# Patient Record
Sex: Male | Born: 1960 | Hispanic: Yes | Marital: Married | Smoking: Never smoker
Health system: Southern US, Community
[De-identification: ages and names within clinical notes are randomized; demographics above are authoritative.]

## PROBLEM LIST (undated history)

## (undated) DIAGNOSIS — R0602 Shortness of breath: Secondary | ICD-10-CM

## (undated) DIAGNOSIS — M7989 Other specified soft tissue disorders: Secondary | ICD-10-CM

## (undated) DIAGNOSIS — M1990 Unspecified osteoarthritis, unspecified site: Secondary | ICD-10-CM

## (undated) DIAGNOSIS — L97909 Non-pressure chronic ulcer of unspecified part of unspecified lower leg with unspecified severity: Secondary | ICD-10-CM

## (undated) DIAGNOSIS — E119 Type 2 diabetes mellitus without complications: Secondary | ICD-10-CM

## (undated) DIAGNOSIS — M549 Dorsalgia, unspecified: Secondary | ICD-10-CM

## (undated) DIAGNOSIS — I872 Venous insufficiency (chronic) (peripheral): Secondary | ICD-10-CM

## (undated) DIAGNOSIS — R5383 Other fatigue: Secondary | ICD-10-CM

## (undated) DIAGNOSIS — M2550 Pain in unspecified joint: Secondary | ICD-10-CM

## (undated) DIAGNOSIS — G4730 Sleep apnea, unspecified: Secondary | ICD-10-CM

## (undated) HISTORY — DX: Sleep apnea, unspecified: G47.30

## (undated) HISTORY — DX: Venous insufficiency (chronic) (peripheral): I87.2

## (undated) HISTORY — DX: Non-pressure chronic ulcer of unspecified part of unspecified lower leg with unspecified severity: L97.909

## (undated) HISTORY — DX: Pain in unspecified joint: M25.50

## (undated) HISTORY — DX: Unspecified osteoarthritis, unspecified site: M19.90

## (undated) HISTORY — DX: Shortness of breath: R06.02

## (undated) HISTORY — PX: HERNIA REPAIR: SHX51

## (undated) HISTORY — DX: Type 2 diabetes mellitus without complications: E11.9

## (undated) HISTORY — DX: Other specified soft tissue disorders: M79.89

## (undated) HISTORY — DX: Dorsalgia, unspecified: M54.9

## (undated) HISTORY — DX: Other fatigue: R53.83

---

## 2006-05-18 ENCOUNTER — Ambulatory Visit (HOSPITAL_COMMUNITY): Admission: RE | Admit: 2006-05-18 | Discharge: 2006-05-19 | Payer: Self-pay | Admitting: General Surgery

## 2013-02-06 ENCOUNTER — Ambulatory Visit (INDEPENDENT_AMBULATORY_CARE_PROVIDER_SITE_OTHER): Payer: 59 | Admitting: Family Medicine

## 2013-02-06 VITALS — BP 116/75 | HR 79 | Temp 98.3°F | Resp 18 | Wt 391.0 lb

## 2013-02-06 DIAGNOSIS — L98499 Non-pressure chronic ulcer of skin of other sites with unspecified severity: Secondary | ICD-10-CM

## 2013-02-06 DIAGNOSIS — I83009 Varicose veins of unspecified lower extremity with ulcer of unspecified site: Secondary | ICD-10-CM

## 2013-02-06 DIAGNOSIS — S81809A Unspecified open wound, unspecified lower leg, initial encounter: Secondary | ICD-10-CM

## 2013-02-06 DIAGNOSIS — IMO0002 Reserved for concepts with insufficient information to code with codable children: Secondary | ICD-10-CM

## 2013-02-06 DIAGNOSIS — L97929 Non-pressure chronic ulcer of unspecified part of left lower leg with unspecified severity: Secondary | ICD-10-CM

## 2013-02-06 DIAGNOSIS — I83029 Varicose veins of left lower extremity with ulcer of unspecified site: Secondary | ICD-10-CM

## 2013-02-06 DIAGNOSIS — I872 Venous insufficiency (chronic) (peripheral): Secondary | ICD-10-CM

## 2013-02-06 DIAGNOSIS — R55 Syncope and collapse: Secondary | ICD-10-CM

## 2013-02-06 DIAGNOSIS — L97909 Non-pressure chronic ulcer of unspecified part of unspecified lower leg with unspecified severity: Secondary | ICD-10-CM

## 2013-02-06 DIAGNOSIS — E669 Obesity, unspecified: Secondary | ICD-10-CM

## 2013-02-06 DIAGNOSIS — G4733 Obstructive sleep apnea (adult) (pediatric): Secondary | ICD-10-CM | POA: Insufficient documentation

## 2013-02-06 LAB — POCT CBC
Granulocyte percent: 73.5 %G (ref 37–80)
HCT, POC: 44.5 % (ref 43.5–53.7)
Hemoglobin: 13.8 g/dL — AB (ref 14.1–18.1)
Lymph, poc: 1.7 (ref 0.6–3.4)
MCH, POC: 27.6 pg (ref 27–31.2)
MCHC: 31 g/dL — AB (ref 31.8–35.4)
MCV: 89 fL (ref 80–97)
MID (cbc): 0.7 (ref 0–0.9)
MPV: 10 fL (ref 0–99.8)
POC Granulocyte: 6.7 (ref 2–6.9)
POC LYMPH PERCENT: 19.1 %L (ref 10–50)
POC MID %: 7.4 %M (ref 0–12)
Platelet Count, POC: 261 10*3/uL (ref 142–424)
RBC: 5 M/uL (ref 4.69–6.13)
RDW, POC: 16.5 %
WBC: 9.1 10*3/uL (ref 4.6–10.2)

## 2013-02-06 LAB — COMPREHENSIVE METABOLIC PANEL
ALT: 13 U/L (ref 0–53)
AST: 12 U/L (ref 0–37)
Albumin: 3.8 g/dL (ref 3.5–5.2)
Alkaline Phosphatase: 86 U/L (ref 39–117)
BUN: 12 mg/dL (ref 6–23)
CO2: 28 mEq/L (ref 19–32)
Calcium: 9.2 mg/dL (ref 8.4–10.5)
Chloride: 103 mEq/L (ref 96–112)
Creat: 0.7 mg/dL (ref 0.50–1.35)
Glucose, Bld: 102 mg/dL — ABNORMAL HIGH (ref 70–99)
Potassium: 5.1 mEq/L (ref 3.5–5.3)
Sodium: 139 mEq/L (ref 135–145)
Total Bilirubin: 0.3 mg/dL (ref 0.3–1.2)
Total Protein: 7 g/dL (ref 6.0–8.3)

## 2013-02-06 LAB — POCT GLYCOSYLATED HEMOGLOBIN (HGB A1C): Hemoglobin A1C: 5.7

## 2013-02-06 LAB — GLUCOSE, POCT (MANUAL RESULT ENTRY): POC Glucose: 144 mg/dl — AB (ref 70–99)

## 2013-02-06 NOTE — Progress Notes (Signed)
Subjective:    Patient ID: Bruce Pace, male    DOB: 15-Oct-1960, 53 y.o.   MRN: [id number] Chief Complaint  Patient presents with  . Dizziness    since yesterday   . infection left leg    HPI This chart was scribed for Carley Hammed Shaw-MD by Smiley Houseman, Scribe. This patient was seen in room 12 and the patient's care was started at 1:56 PM.  HPI Comments: Bruce Pace is a 53 y.o. male with a h/o of obesity, OSA on CPAP, and chronic lower ext stasis dermatitis who presents to the [facility] complaining of an wound infection in his left leg that started about 3 months ago.  He has had problems with skin sores prior but this one just keeps getting bigger and worse so eventually decided to come in.  He has not received any prior medical care for this wound in the past 3 mos, though has required unna boots and/or antibiotics for others prior.  Pt states he has a burning sensation on his left leg occ, is present a little more at night but otherwise no pain in leg.  Pt states that he has been drinking garlic and using topical garlic poltice to help heal his wound.  Pt denies taking any oral medications or otc products/supplements to help other than occ prn tylenol. Pt denies h/o of diabetes.  Denies any loss of sensation in legs or feet.  Pt also complains of intermittent dizziness that started yesterday while he was working on a wall in his home.  He states that he had been standing for about 20 minutes when he began to feel light headed like he was going to pass out.  Did not pass out, no vertigo.  Pt states he leaned against his counter to regain his stability.  He states that the dizziness was intermittent throughout the rest of the day but not as severe.  He did note some nausea as well today, no vomiting, no other sxs at all, no other changes.  Pt denies syncope, chest pain, SOB, and the feeling of his heart racing.  He states that he has normal voids and normal bowel movements.   Pt denies any h/o of heart disease.   Pt was last seen here in 07/2010. He has not been seen by any physician or received any medical care since then. Pt has a h/o of hyperglycemia but prior hgba1cs have always been completely nml.  Paper chart reviewed - last labs in July 2012 show a HgbA1C was 5.5.  His cholesterol, CMP, Tsh, and Vitamin D were all normal. (trig mildly elev at 250s w/ low hdl but pt was NOT fasting), LDL 97 - direct LDL 117.  In July 2012, his sleep study showed very severe OSA.  He was titrated to CPAP at 15 cm of water which he has been using nightly.     Past Surgical History  Procedure Laterality Date  . Hernia repair      History reviewed. No pertinent family history.  History   Social History  . Marital Status: Married    Spouse Name: N/A    Number of Children: N/A  . Years of Education: N/A   Occupational History  . Not on file.   Social History Main Topics  . Smoking status: Never Smoker   . Smokeless tobacco: Not on file  . Alcohol Use: No  . Drug Use: No  . Sexual Activity: Not on file  Other Topics Concern  . Not on file   Social History Narrative  . No narrative on file    No Known Allergies  There are no active problems to display for this patient.  Review of Systems  Constitutional: Negative for fever and chills.  HENT: Negative for congestion and rhinorrhea.   Respiratory: Negative for cough and shortness of breath.   Cardiovascular: Positive for leg swelling. Negative for chest pain and palpitations.  Gastrointestinal: Positive for nausea. Negative for vomiting, abdominal pain and diarrhea.  Endocrine: Negative for polydipsia, polyphagia and polyuria.  Genitourinary: Negative for urgency, frequency, decreased urine volume and difficulty urinating.  Musculoskeletal: Positive for arthralgias and gait problem. Negative for back pain.  Skin: Positive for wound (left leg). Negative for color change and rash.  Neurological: Positive for  dizziness and light-headedness. Negative for syncope, weakness and numbness.  Hematological: Negative for adenopathy. Does not bruise/bleed easily.  Psychiatric/Behavioral: Negative for sleep disturbance.   Triage Vitals: BP 118/72  Pulse 82  Temp(Src) 98.3 F (36.8 C) (Oral)  Resp 18  Wt 391 lb (177.356 kg)  SpO2 93%    Objective:   Physical Exam  Nursing note and vitals reviewed. Constitutional: He is oriented to person, place, and time. He appears well-developed and well-nourished. He does not appear ill. No distress.  Morbid obesity  HENT:  Head: Normocephalic and atraumatic.  Right Ear: Tympanic membrane, external ear and ear canal normal.  Left Ear: Tympanic membrane, external ear and ear canal normal.  Nose: Nose normal. No mucosal edema or rhinorrhea.  Mouth/Throat: Uvula is midline, oropharynx is clear and moist and mucous membranes are normal. No oropharyngeal exudate, posterior oropharyngeal edema or posterior oropharyngeal erythema.  Eyes: Conjunctivae and EOM are normal. Right eye exhibits no discharge. Left eye exhibits no discharge.  Neck: Trachea normal. Neck supple. No tracheal deviation present. No thyromegaly present.  Cardiovascular: Normal rate, regular rhythm, S1 normal, S2 normal, normal heart sounds and normal pulses.   No murmur heard. Pulses:      Dorsalis pedis pulses are 2+ on the right side, and 2+ on the left side.  Pulmonary/Chest: Effort normal and breath sounds normal. No respiratory distress. He has no wheezes. He has no rhonchi. He has no rales.  Musculoskeletal: Normal range of motion.  Lymphadenopathy:       Head (right side): No submandibular, no tonsillar, no preauricular, no posterior auricular and no occipital adenopathy present.       Head (left side): No submandibular, no tonsillar, no preauricular, no posterior auricular and no occipital adenopathy present.    He has no cervical adenopathy.       Right: No supraclavicular adenopathy  present.       Left: No supraclavicular adenopathy present.  Neurological: He is alert and oriented to person, place, and time. He displays no atrophy. No sensory deficit.  Neg monofil bilateraly to feet  Skin: Skin is warm and dry. Lesion noted. No bruising, no ecchymosis, no laceration and no petechiae noted. No erythema.  4 cm diameter ulceration on left anterior medial shin with red rolled granulation tissue around borders and base with small amount of necrotic yellow exudate covering base as well.  2+ pitting edema in legs to mid calf bilaterally with thickened darkened scaling skin surrounding.  Toenails yellow, thickened, hypertrophic.   Psychiatric: He has a normal mood and affect. His behavior is normal.   Results for orders placed in visit on 02/06/13  POCT CBC  Result Value Range   WBC 9.1  4.6 - 10.2 K/uL   Lymph, poc 1.7  0.6 - 3.4   POC LYMPH PERCENT 19.1  10 - 50 %L   MID (cbc) 0.7  0 - 0.9   POC MID % 7.4  0 - 12 %M   POC Granulocyte 6.7  2 - 6.9   Granulocyte percent 73.5  37 - 80 %G   RBC 5.00  4.69 - 6.13 M/uL   Hemoglobin 13.8 (*) 14.1 - 18.1 g/dL   HCT, POC 81.1  91.4 - 53.7 %   MCV 89.0  80 - 97 fL   MCH, POC 27.6  27 - 31.2 pg   MCHC 31.0 (*) 31.8 - 35.4 g/dL   RDW, POC 78.2     Platelet Count, POC 261  142 - 424 K/uL   MPV 10.0  0 - 99.8 fL  GLUCOSE, POCT (MANUAL RESULT ENTRY)      Result Value Range   POC Glucose 144 (*) 70 - 99 mg/dl  POCT GLYCOSYLATED HEMOGLOBIN (HGB A1C)      Result Value Range   Hemoglobin A1C 5.7     EKG: NSR, no ischemic changes, unchanged from prior EKG on 07/2010 - scanned into Epic now.   negative orthostatics Assessment & Plan:  Informed pt that his sugars have increased and are prediabetic, so he needs to be on a low carb diet to lose weight.  Explained to the pt the need for an ultra sound of his heart.  Told pt that I referred him to a wound clinic since the infection is larger than his last visit.  Informed pt that he  needs to come back in a couple of days to recheck his infection and to ensure the dizziness has subsided.  Explained to pt the need to increase his water intake to decrease the swelling in this lower extremities.    Obese - Plan: EKG 12-Lead, POCT CBC, POCT glucose (manual entry), POCT glycosylated hemoglobin (Hb A1C), Comprehensive metabolic panel, TSH - has borderline pre-DM w/ hgba1c of 5.7.  Needs weighloss, low carb diet advised.  Pre-syncope - Plan: EKG 12-Lead, POCT CBC, POCT glucose (manual entry), POCT glycosylated hemoglobin (Hb A1C), Comprehensive metabolic panel, TSH, 2D Echocardiogram without contrast - unknown etiology - EKG and orthostatics unremarkable. BP low nml but unsure of baseline of pt since he has been out of medical care for a while. Check echo and advise watchful waiting. Hopefully will resolve w/ high protein diet, push fluids but if cont or worsens, RTC for further eval. Recheck 1 wk if improving.   Chronic venous stasis dermatitis of both lower extremities - needs to restart compression hose. Emphasized high protein diet, low carb, plenty of water to reduce edema and aid in wound healing. Pt's prior lipid panel 3 yrs ago was actually pretty good so no sig concern about high chol diet at this time.  Venous stasis ulcer of left lower extremity - Plan: AMB referral to wound care center - wound cleaned with soapy water after topical emla cream and small amount necrotic tissue off base debrided with gauze, wound bed of healthy granulation tissue visible. Unna boot applied to entire left lower leg by myself and ace wrap covering. Leave in place x 1 wk - then remove here and consider replacing if healing if pt does not have appt w/ wound clinic yet.  Meds ordered this encounter  Medications  . acetaminophen (TYLENOL) 500 MG tablet  Sig: Take 500 mg by mouth every 6 (six) hours as needed.    I personally performed the services described in this documentation, which was scribed  in my presence. The recorded information has been reviewed and considered, and addended by me as needed.  Norberto SorensonEva Shaw, MD MPH

## 2013-02-06 NOTE — Patient Instructions (Addendum)
Stasis Ulcer Stasis ulcers occur in the legs when the circulation is damaged. An ulcer may look like a small hole in the skin.  CAUSES Stasis ulcers occur because your veins do not work properly. Veins have valves that help the blood return to the heart. If these valves do not work right, blood flows backwards and backs up into the veins near the skin. This condition causes the veins to become larger because of increased pressure and may lead to a stasis ulcer. SYMPTOMS   Shallow (superficial) sore on the leg.  Clear drainage or weeping from the sore.  Leg pain or a feeling of heaviness. This may be worse at the end of the day.  Leg swelling.  Skin color changes. DIAGNOSIS  Your caregiver will make a diagnosis by examining your leg. Your caregiver may order tests such as an ultrasound or other studies to evaluate the blood flow of the leg. HOME CARE INSTRUCTIONS   Do not stand or sit in one position for long periods of time. Do not sit with your legs crossed. Rest with your legs raised during the day. If possible, it is best if you can elevate your legs above your heart for 30 minutes, 3 to 4 times a day.  Wear elastic stockings or support hose. Do not wear other tight encircling garments around legs, pelvis, or waist. This causes increased pressure in your veins. If your caregiver has applied compressive medicated wraps, use them as instructed.  Walk as much as possible to increase blood flow. If you are taking long rides in a car or plane, take a break to walk around every 2 hours. If not already on aspirin, take a baby aspirin before long trips unless you have medical reasons that prohibit this.  Raise the foot of your bed at night with 2-inch blocks if approved by your caregiver. This may not be desirable if you have heart failure or breathing problems.  If you get a cut in the skin over the vein and the vein bleeds, lie down with your leg raised and gently clean the area with a clean  cloth. Apply pressure on the cut until the bleeding stops. Then place a dressing on the cut. See your caregiver if it continues to bleed or needs stitches. Also, see your caregiver if you develop an infection.Signs of an infection include a fever, redness, increased pain, and drainage of pus.  If your caregiver has given you a follow-up appointment, it is very important to keep that appointment. Not keeping the appointment could result in a chronic or permanent injury, pain, and disability. If there is any problem keeping the appointment, call your caregiver for assistance. SEEK IMMEDIATE MEDICAL CARE IF:  The ulcer area starts to break down.  You have pain, redness, tenderness, pus, or hard swelling in your leg over a vein or near the ulcer.  Your leg pain is uncomfortable.  You develop an unexplained fever.  You develop chest pain or shortness of breath. Document Released: 10/04/2000 Document Revised: 04/03/2011 Document Reviewed: 05/01/2010 [facility] Patient Information 2014 [place]. Near-Syncope Near-syncope (commonly known as near fainting) is sudden weakness, dizziness, or feeling like you might pass out. During an episode of near-syncope, you may also develop pale skin, have tunnel vision, or feel sick to your stomach (nauseous). Near-syncope may occur when getting up after sitting or while standing for a long time. It is caused by a sudden decrease in blood flow to the brain. This decrease can result  from various causes or triggers, most of which are not serious. However, because near-syncope can sometimes be a sign of something serious, a medical evaluation is required. The specific cause is often not determined. HOME CARE INSTRUCTIONS  Monitor your condition for any changes. The following actions may help to alleviate any discomfort you are experiencing:  Have someone stay with you until you feel stable.  Lie down right away if you start feeling like you might faint. Breathe  deeply and steadily. Wait until all the symptoms have passed. Most of these episodes last only a few minutes. You may feel tired for several hours.   Drink enough fluids to keep your urine clear or pale yellow.   If you are taking blood pressure or heart medicine, get up slowly when seated or lying down. Take several minutes to sit and then stand. This can reduce dizziness.  Follow up with your health care provider as directed. SEEK IMMEDIATE MEDICAL CARE IF:   You have a severe headache.   You have unusual pain in the chest, abdomen, or back.   You are bleeding from the mouth or rectum, or you have black or tarry stool.   You have an irregular or very fast heartbeat.   You have repeated fainting or have seizure-like jerking during an episode.   You faint when sitting or lying down.   You have confusion.   You have difficulty walking.   You have severe weakness.   You have vision problems.  MAKE SURE YOU:   Understand these instructions.  Will watch your condition.  Will get help right away if you are not doing well or get worse. Document Released: 01/09/2005 Document Revised: 09/11/2012 Document Reviewed: 06/14/2012 [facility] Patient Information 2014 [place], [place].

## 2013-02-07 LAB — TSH: TSH: 2.266 u[IU]/mL (ref 0.350–4.500)

## 2013-02-09 ENCOUNTER — Encounter: Payer: Self-pay | Admitting: Family Medicine

## 2013-02-14 ENCOUNTER — Ambulatory Visit (INDEPENDENT_AMBULATORY_CARE_PROVIDER_SITE_OTHER): Payer: 59 | Admitting: Family Medicine

## 2013-02-14 VITALS — BP 124/84 | HR 73 | Temp 98.4°F | Resp 17 | Ht 66.5 in | Wt 372.0 lb

## 2013-02-14 DIAGNOSIS — I83009 Varicose veins of unspecified lower extremity with ulcer of unspecified site: Secondary | ICD-10-CM

## 2013-02-14 DIAGNOSIS — L97909 Non-pressure chronic ulcer of unspecified part of unspecified lower leg with unspecified severity: Secondary | ICD-10-CM

## 2013-02-14 DIAGNOSIS — I83029 Varicose veins of left lower extremity with ulcer of unspecified site: Secondary | ICD-10-CM

## 2013-02-14 DIAGNOSIS — L97929 Non-pressure chronic ulcer of unspecified part of left lower leg with unspecified severity: Secondary | ICD-10-CM

## 2013-02-14 LAB — POCT CBC
Granulocyte percent: 77.6 %G (ref 37–80)
HCT, POC: 44.9 % (ref 43.5–53.7)
Hemoglobin: 14.1 g/dL (ref 14.1–18.1)
Lymph, poc: 1.6 (ref 0.6–3.4)
MCH, POC: 27.7 pg (ref 27–31.2)
MCHC: 31.4 g/dL — AB (ref 31.8–35.4)
MCV: 88.3 fL (ref 80–97)
MID (cbc): 0.6 (ref 0–0.9)
MPV: 9.9 fL (ref 0–99.8)
POC Granulocyte: 7.8 — AB (ref 2–6.9)
POC LYMPH PERCENT: 16.2 %L (ref 10–50)
POC MID %: 6.2 %M (ref 0–12)
Platelet Count, POC: 274 10*3/uL (ref 142–424)
RBC: 5.09 M/uL (ref 4.69–6.13)
RDW, POC: 16.7 %
WBC: 10.1 10*3/uL (ref 4.6–10.2)

## 2013-02-14 MED ORDER — CEPHALEXIN 500 MG PO CAPS: 500.0000 mg | ORAL_CAPSULE | Freq: Three times a day (TID) | ORAL | Status: DC

## 2013-02-14 NOTE — Patient Instructions (Addendum)
Wound care appt: Thursday 2/5 at 9:15 am Address: [address], [place], [place] [address] [phone]   To find out more about weight loss surgery through [facility]:  [email or web address] Or call-  [phone]  Please come and see us in 4- 5 days and we will retreat your leg ulcer  Take the keflex antibiotic as directed  If you are feeling worse or are having a fever or flu- like symptoms please return right away.

## 2013-02-14 NOTE — Progress Notes (Signed)
[facility] [address] [place] [address] [phone]  Date:  02/14/2013   Name:  Bruce Pace   DOB:  [birth year]   MRN:  [id number]  PCP:  No primary provider on file.    Chief Complaint: Follow-up   History of Present Illness:  Bruce Pace is a 53 y.o. very pleasant male patient who presents with the following:  Seen here 8 days ago with exacerbation of a chronic venous stasis demattiits. He was noted to have a 4cm ulceration of the left anterior medial shin with necrotic base.  He was referred to wound care center and treated with an unnaboot. He has left the unna boot on and is here today to have this looked at.  He has not yet heard about his wound care appt.  He notes that he has felt a little dizzy on occasion but overall he does not feel bad. No fever.    He is really trying to lose wieght- he has cut out soda in exchange for water, and is avoidiing sugars. He is hopeful that this might have helped, but he would also be interested in learning more about weight loss surgery   Patient Active Problem List   Diagnosis Date Noted  . Obstructive sleep apnea 02/06/2013  . Chronic venous stasis dermatitis of both lower extremities 02/06/2013    History reviewed. No pertinent past medical history.  Past Surgical History  Procedure Laterality Date  . Hernia repair      History  Substance Use Topics  . Smoking status: Never Smoker   . Smokeless tobacco: Not on file  . Alcohol Use: No    History reviewed. No pertinent family history.  No Known Allergies  Medication list has been reviewed and updated.  Current Outpatient Prescriptions on File Prior to Visit  Medication Sig Dispense Refill  . acetaminophen (TYLENOL) 500 MG tablet Take 500 mg by mouth every 6 (six) hours as needed.       No current facility-administered medications on file prior to visit.    Review of Systems:  As per HPI- otherwise negative.   Physical  Examination: Filed Vitals:   02/14/13 1058  BP: 124/84  Pulse: 73  Temp: 98.4 F (36.9 C)  Resp: 17   Filed Vitals:   02/14/13 1058  Height: 5' 6.5" (1.689 m)  Weight: 372 lb (168.738 kg)   Body mass index is 59.15 kg/(m^2). Ideal Body Weight: Weight in (lb) to have BMI = 25: 156.9  GEN: WDWN, NAD, Non-toxic, A & O x 3, morbid obesity HEENT: Atraumatic, Normocephalic. Neck supple. No masses, No LAD. Ears and Nose: No external deformity. CV: RRR, No M/G/R. No JVD. No thrill. No extra heart sounds. PULM: CTA B, no wheezes, crackles, rhonchi. No retractions. No resp. distress. No accessory muscle use. EXTR: No c/c/e NEURO Normal gait.  PSYCH: Normally interactive. Conversant. Not depressed or anxious appearing.  Calm demeanor.  Left LE: he continues to have an ulcer on the medial left shin.  There is an odor and some discharge on his unna boot and ace wrap that suggest infection.  However he states that the area looks as good or better than it has.  His swelling is reduced.   Evidence of chronic venous stasis on both LE with hyperpigmentation and edema  Area cleaned with peroxide, placed vaseline gauze and then an unna- boot and ace wrap X2 over his ulcer.   Wt Readings from Last 3  Encounters:  02/14/13 372 lb (168.738 kg)  02/06/13 391 lb (177.356 kg)     Results for orders placed in visit on 02/14/13  POCT CBC      Result Value Range   WBC 10.1  4.6 - 10.2 K/uL   Lymph, poc 1.6  0.6 - 3.4   POC LYMPH PERCENT 16.2  10 - 50 %L   MID (cbc) 0.6  0 - 0.9   POC MID % 6.2  0 - 12 %M   POC Granulocyte 7.8 (*) 2 - 6.9   Granulocyte percent 77.6  37 - 80 %G   RBC 5.09  4.69 - 6.13 M/uL   Hemoglobin 14.1  14.1 - 18.1 g/dL   HCT, POC 40.944.9  81.143.5 - 53.7 %   MCV 88.3  80 - 97 fL   MCH, POC 27.7  27 - 31.2 pg   MCHC 31.4 (*) 31.8 - 35.4 g/dL   RDW, POC 91.416.7     Platelet Count, POC 274  142 - 424 K/uL   MPV 9.9  0 - 99.8 fL     Assessment and Plan: Venous stasis ulcer of left  lower extremity - Plan: POCT CBC, cephALEXin (KEFLEX) 500 MG capsule  Morbid obesity  Replaced unna boot and bandage as above.  Start on keflex for possible early infection He will RTC for recheck in 4 or 5 days- Sooner if worse.  If any fever or flu like sx come back right away!  Made wound care appt for him in about 2 weeks May benefit from hyperbaric therapy [facility] so he can learn more about his options encouraged him to continue his weight loss efforts- he is already making progress   Signed Abbe AmsterdamJessica Luwana Butrick, MD

## 2013-02-27 ENCOUNTER — Encounter (HOSPITAL_BASED_OUTPATIENT_CLINIC_OR_DEPARTMENT_OTHER): Payer: Managed Care, Other (non HMO) | Attending: Internal Medicine

## 2013-02-27 DIAGNOSIS — L97809 Non-pressure chronic ulcer of other part of unspecified lower leg with unspecified severity: Secondary | ICD-10-CM | POA: Insufficient documentation

## 2013-02-27 DIAGNOSIS — I872 Venous insufficiency (chronic) (peripheral): Secondary | ICD-10-CM | POA: Insufficient documentation

## 2013-02-27 DIAGNOSIS — I87309 Chronic venous hypertension (idiopathic) without complications of unspecified lower extremity: Secondary | ICD-10-CM | POA: Insufficient documentation

## 2013-02-28 NOTE — Progress Notes (Signed)
[facility]  NAME:  Bruce Pace, [name]                ACCOUNT NO.:  [id number]  MEDICAL RECORD NO.:  [id number]      DATE OF BIRTH:  [birth year]  PHYSICIAN:  Maxwell CaulMichael G. Caryl Manas, M.D.      VISIT DATE:                                  OFFICE VISIT   HISTORY:  This is a 53 year old man [facility] a wound that had been present for the last 3 months on his left leg.  The patient does not give a history of trauma. He has a history of venous stasis type wounds previously but they appeared to have healed spontaneously.  He is not a diabetic and does not have any known arterial problems.  He was seen on February 06, 2013, for this wound.  He had the wound cleaned and a small amount of necrotic tissue was debrided.  He had an Radio broadcast assistantUnna boot applied.  At that point in time, the wound measured 4 cm.  PAST MEDICAL HISTORY: 1. Chronic venous stasis of both lower extremities. 2. Previous venous stasis ulcerations. 3. Morbid obesity. 4. Hernia repair.  MEDICATION LIST:  Reviewed.  He is only on Tylenol.  He was put on an antibiotic although I do not see precisely which one this was.  PHYSICAL EXAMINATION:  VITAL SIGNS:  Temperature is 98.3, pulse 67, respirations 16, blood pressure 143/88, weight 370 pounds.  His ABI is measured in this clinic was 1.14 on the left. RESPIRATORY:  Shallow but otherwise clear air entry bilaterally. CARDIAC:  Heart sounds are normal.  There were no signs of congestive heart failure. EXTREMITIES:  Left lower extremity reveals changes of severe venous stasis left greater than the right.  His dorsalis pedis pulse was briskly palpable, and his capillary refill time was normal.  The wound is on the medial aspect of the left leg.  This has already filled in surprisingly with healthy epithelium.  There is no evidence of infection.  The overall diameter of the wound area was 4.5 x 3.6 x 0.1, however, as  mentioned the center part of this wound had already epithelialized fairly nicely.  IMPRESSIONS:  Venous hypertension with a history of venous stasis ulceration.  This had already done nicely presumably with previous Unna wraps.  We applied collagen Hydrogel and replace the unna.  He will eventually need graded pressure stockings.  The patient works as a Education administratorpainter.  He is on his feet for large parts of the work day.  We will see him again in a week's time.  I did not prescribe any additional antibiotics, none were deemed necessary.          ______________________________ Maxwell CaulMichael G. Tanzania Basham, M.D.     MGR/MEDQ  D:  02/27/2013  T:  02/28/2013  Job:  [id number]

## 2014-08-13 ENCOUNTER — Ambulatory Visit (INDEPENDENT_AMBULATORY_CARE_PROVIDER_SITE_OTHER): Payer: BLUE CROSS/BLUE SHIELD | Admitting: Family Medicine

## 2014-08-13 VITALS — BP 102/68 | HR 78 | Temp 98.3°F | Resp 18 | Ht 67.0 in | Wt 380.0 lb

## 2014-08-13 DIAGNOSIS — L03116 Cellulitis of left lower limb: Secondary | ICD-10-CM

## 2014-08-13 DIAGNOSIS — Z6841 Body Mass Index (BMI) 40.0 and over, adult: Secondary | ICD-10-CM | POA: Insufficient documentation

## 2014-08-13 MED ORDER — AMOXICILLIN-POT CLAVULANATE 875-125 MG PO TABS: 1.0000 | ORAL_TABLET | Freq: Two times a day (BID) | ORAL | Status: DC

## 2014-08-13 MED ORDER — FUROSEMIDE 40 MG PO TABS: 40.0000 mg | ORAL_TABLET | Freq: Every day | ORAL | Status: DC

## 2014-08-13 NOTE — Progress Notes (Signed)
This chart was scribed for Elvina Sidle, MD by Stann Ore, medical scribe at [facility].The patient was seen in exam room 9 and the patient's care was started at 11:10 AM.  Patient ID: Bruce Pace MRN: [id number], DOB: 26-Apr-1960, 54 y.o. Date of Encounter: 08/13/2014  Primary Physician: No PCP Per Patient  Chief Complaint:  Chief Complaint  Patient presents with  . leg wound    left side, few months  . Leg Swelling    right side, x 1 week    HPI:  Bruce Pace is a 54 y.o. male who presents to [facility] complaining of swelling in his right leg that started last week.  He also has an infected wound on his left leg for a few months. He denies any known drug allergies.   He wants to loose some weight.   He works as a Education administrator for [employer].   History reviewed. No pertinent past medical history.   Home Meds: Prior to Admission medications   Medication Sig Start Date End Date Taking? Authorizing Provider  Naproxen Sodium (ALEVE PO) Take 20 mg by mouth daily.   Yes Historical Provider, MD  acetaminophen (TYLENOL) 500 MG tablet Take 500 mg by mouth every 6 (six) hours as needed.    Historical Provider, MD  cephALEXin (KEFLEX) 500 MG capsule Take 1 capsule (500 mg total) by mouth 3 (three) times daily. Patient not taking: Reported on 08/13/2014 02/14/13   Pearline Cables, MD    Allergies: No Known Allergies  History   Social History  . Marital Status: Married    Spouse Name: N/A  . Number of Children: N/A  . Years of Education: N/A   Occupational History  . Not on file.   Social History Main Topics  . Smoking status: Never Smoker   . Smokeless tobacco: Not on file  . Alcohol Use: No  . Drug Use: No  . Sexual Activity: Not on file   Other Topics Concern  . Not on file   Social History Narrative     Review of Systems: Constitutional: negative for chills, fever, night sweats, weight changes, or fatigue    HEENT: negative for vision changes, hearing loss, congestion, rhinorrhea, ST, epistaxis, or sinus pressure Cardiovascular: negative for chest pain or palpitations Respiratory: negative for hemoptysis, wheezing, shortness of breath, or cough Abdominal: negative for abdominal pain, nausea, vomiting, diarrhea, or constipation Dermatological: negative for rash; positive for wound (left leg) Neurologic: negative for headache, dizziness, or syncope Musc: positive for swelling (right leg)  All other systems reviewed and are otherwise negative with the exception to those above and in the HPI.  Physical Exam: Blood pressure 102/68, pulse 78, temperature 98.3 F (36.8 C), temperature source Oral, resp. rate 18, height 5\' 7"  (1.702 m), weight 380 lb (172.367 kg), SpO2 97 %., Body mass index is 59.5 kg/(m^2). General: Well developed, well nourished, in no acute distress. Head: Normocephalic, atraumatic, eyes without discharge, sclera non-icteric, nares are without discharge. Bilateral auditory canals clear, TM's are without perforation, pearly grey and translucent with reflective cone of light bilaterally. Oral cavity moist, posterior pharynx without exudate, erythema, peritonsillar abscess, or post nasal drip.  Neck: Supple. No thyromegaly. Full ROM. No lymphadenopathy. Lungs: Clear bilaterally to auscultation without wheezes, rales, or rhonchi. Breathing is unlabored. Heart: RRR with S1 S2. No murmurs, rubs, or gallops appreciated. Abdomen: Soft, non-tender, non-distended with normoactive bowel sounds. No hepatomegaly. No rebound/guarding. No obvious  abdominal masses. His abdomen is protuberant with a large panniculus. Msk:  Strength and tone normal for age. Extremities/Skin:  Patient has a 1-1/2 cm ulceration on the left calf. Bilaterally there are marked postphlebitic changes. He has 2+ edema up to the knees. Neuro: Alert and oriented X 3. Moves all extremities spontaneously. Gait is normal. CNII-XII  grossly in tact. Psych:  Responds to questions appropriately with a normal affect.    I spent extra time discussing diet , trying to do patient reduce his carbohydrates and salt. He understands that he has morbid obesity and that the only solution for his chronic knee pain and leg ulcerations will be weight loss.  ASSESSMENT AND PLAN:  54 y.o. year old male with male with   This chart was scribed in my presence and reviewed by me personally.    ICD-9-CM ICD-10-CM   1. Cellulitis of left lower extremity 682.6 L03.116 amoxicillin-clavulanate (AUGMENTIN) 875-125 MG per tablet     furosemide (LASIX) 40 MG tablet  2. Morbid obesity 278.01 E66.01 Ambulatory referral to General Surgery     Signed, Elvina Sidle, MD  Signed, Elvina Sidle, MD 08/13/2014 11:10 AM

## 2014-08-13 NOTE — Patient Instructions (Signed)
1. Wash the legs twice a day with soap and water  2 the main problem is your weight. I'm referring him to a surgeon to explore bypass surgery 3 any to avoid large amounts of salt reduce the swelling in your legs.  4 please return in 1-2 weeks if the laser not improving. Otherwise I like to see you back in a month.

## 2014-09-29 ENCOUNTER — Ambulatory Visit (INDEPENDENT_AMBULATORY_CARE_PROVIDER_SITE_OTHER): Payer: BLUE CROSS/BLUE SHIELD | Admitting: Physician Assistant

## 2014-09-29 VITALS — BP 124/80 | HR 72 | Temp 97.9°F | Resp 20 | Ht 66.5 in | Wt 381.0 lb

## 2014-09-29 DIAGNOSIS — I83008 Varicose veins of unspecified lower extremity with ulcer other part of lower leg: Secondary | ICD-10-CM | POA: Diagnosis not present

## 2014-09-29 DIAGNOSIS — I872 Venous insufficiency (chronic) (peripheral): Secondary | ICD-10-CM | POA: Diagnosis not present

## 2014-09-29 DIAGNOSIS — Z131 Encounter for screening for diabetes mellitus: Secondary | ICD-10-CM

## 2014-09-29 DIAGNOSIS — I83009 Varicose veins of unspecified lower extremity with ulcer of unspecified site: Secondary | ICD-10-CM

## 2014-09-29 DIAGNOSIS — L97909 Non-pressure chronic ulcer of unspecified part of unspecified lower leg with unspecified severity: Secondary | ICD-10-CM

## 2014-09-29 DIAGNOSIS — G4733 Obstructive sleep apnea (adult) (pediatric): Secondary | ICD-10-CM | POA: Diagnosis not present

## 2014-09-29 LAB — COMPLETE METABOLIC PANEL WITH GFR
ALT: 13 U/L (ref 9–46)
AST: 12 U/L (ref 10–35)
Albumin: 3.7 g/dL (ref 3.6–5.1)
Alkaline Phosphatase: 76 U/L (ref 40–115)
BUN: 12 mg/dL (ref 7–25)
CO2: 29 mmol/L (ref 20–31)
Calcium: 9.2 mg/dL (ref 8.6–10.3)
Chloride: 102 mmol/L (ref 98–110)
Creat: 0.65 mg/dL — ABNORMAL LOW (ref 0.70–1.33)
GFR, Est African American: >89 mL/min (ref >=60)
GFR, Est Non African American: >89 mL/min (ref >=60)
Glucose, Bld: 93 mg/dL (ref 65–99)
Potassium: 4.5 mmol/L (ref 3.5–5.3)
Sodium: 139 mmol/L (ref 135–146)
Total Bilirubin: 0.3 mg/dL (ref 0.2–1.2)
Total Protein: 7 g/dL (ref 6.1–8.1)

## 2014-09-29 LAB — GLUCOSE, POCT (MANUAL RESULT ENTRY): POC Glucose: 100 mg/dl — AB (ref 70–99)

## 2014-09-29 LAB — POCT GLYCOSYLATED HEMOGLOBIN (HGB A1C): Hemoglobin A1C: 5.8

## 2014-09-29 MED ORDER — DOXYCYCLINE HYCLATE 100 MG PO CAPS: 100.0000 mg | ORAL_CAPSULE | Freq: Two times a day (BID) | ORAL | Status: AC

## 2014-09-29 NOTE — Patient Instructions (Signed)
I am referring you to wound care.  You will come back for the wound care, IF wound clinic does not contact you with an appointment that is within a week from now, please come back here in 1 week.   I am referring you to a nutritionist.  Please await the call.

## 2014-09-29 NOTE — Progress Notes (Signed)
[facility] [address] [place] [address] [phone]  Date:  09/29/2014   Name:  Bruce Pace   DOB:  05-05-1960   MRN:  [id number]  PCP:  No PCP Per Patient    History of Present Illness:  Bruce Pace is a 54 y.o. male patient who presents to [facility] for chief complaint of sores/wounds on his legs.  Patient was seen about 1 month ago and treated for cellulitis. He was given Augmentin and advised to return in one week if his wounds did not heal. He should did not return to the clinic and states that he has continued to have difficulty with the healing of these wounds. There is mild tenderness to the wound area. He denies loss of sensation to lower extremities or feet. He denies fever, nausea, shortness of breath, or dizziness. 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 He uses soap, water, and hydrogen peroxide. Not cover the wounds when he works. Patient is currently not on any type of medication. He struggles with central obesity. Dr. Milus Glazier had referred him to general surgery for possible bypass. Patient reports that he did attend the meeting, but has difficulty with dieting. He has difficulty understanding what to eat. He consumes vegetables meats, tortillas, and drinks carbonated beverages. He does not exercise due to knee pain.   Patient is struggled with weight throughout his childhood and into a total life. Difficulty staying motivated with dieting and is confused as to what to eat.  Also complains of sleep machine.  Uses cpap machine for slumber, but has had masks for years.  He is concerned that they do not fit properly any more.     Patient Active Problem List   Diagnosis Date Noted  . Morbid obesity 08/13/2014  . Obstructive sleep apnea 02/06/2013  . Chronic venous stasis dermatitis of both lower extremities 02/06/2013    History reviewed. No pertinent past medical  history.  Past Surgical History  Procedure Laterality Date  . Hernia repair      Social History  Substance Use Topics  . Smoking status: Never Smoker   . Smokeless tobacco: None  . Alcohol Use: No    History reviewed. No pertinent family history.  No Known Allergies  Medication list has been reviewed and updated.  Current Outpatient Prescriptions on File Prior to Visit  Medication Sig Dispense Refill  . acetaminophen (TYLENOL) 500 MG tablet Take 500 mg by mouth every 6 (six) hours as needed.    Marland Kitchen amoxicillin-clavulanate (AUGMENTIN) 875-125 MG per tablet Take 1 tablet by mouth 2 (two) times daily. (Patient not taking: Reported on 09/29/2014) 40 tablet 0  . furosemide (LASIX) 40 MG tablet Take 1 tablet (40 mg total) by mouth daily. (Patient not taking: Reported on 09/29/2014) 30 tablet 3   No current facility-administered medications on file prior to visit.    ROS ROS otherwise unremarkable unless listed above.  Physical Examination: BP 124/80 mmHg  Pulse 72  Temp(Src) 97.9 F (36.6 C) (Oral)  Resp 20  Ht 5' 6.5" (1.689 m)  Wt 381 lb (172.82 kg)  BMI 60.58 kg/m2  SpO2 94% Ideal Body Weight: Weight in (lb) to have BMI = 25: 156.9  Physical Exam  Constitutional: He is oriented to person, place, and time. He appears well-developed and well-nourished.  HENT:  Head: Normocephalic and atraumatic.  Eyes: EOM  are normal. Pupils are equal, round, and reactive to light.  Cardiovascular: Normal rate, regular rhythm and normal heart sounds.  Exam reveals no friction rub.   No murmur heard. Pulmonary/Chest: Effort normal and breath sounds normal. No respiratory distress. He has no wheezes.  Neurological: He is alert and oriented to person, place, and time.  Skin: Skin is warm and dry.  Bilateral hyperpigmentation that begins from the shin and extends inferiorly sparing the ankles distally. Left leg features a bout a 4 cm ulceration with necrotic borders. Mildly weeping clear  drainage. Smaller ulceration just lateral with necrotic tissue covering wound that reaches through the dermis. Right leg with 2 cm ulceration with necrotic dried tissue and debris.  Psychiatric: He has a normal mood and affect. His behavior is normal.   Results for orders placed or performed in visit on 09/29/14  POCT glucose (manual entry)  Result Value Ref Range   POC Glucose 100 (A) 70 - 99 mg/dl  POCT glycosylated hemoglobin (Hb A1C)  Result Value Ref Range   Hemoglobin A1C 5.8     Procedure: Verbal consent obtained.  Wounds cleansed with soap and water.  Generous debridement with pulling necrotic skin, and pressured washing.  Fabric and hair were removed in abundance.    Assessment and Plan: 54 year old male with chronic venous insufficiency is here today with chief complaint of wounds on the legs.   1. Stasis ulcer of lower extremity, unspecified laterality -Unna boot placed after debridement. I'm referring to him to a wound care clinic but of course advised him to return in one week if wound clinic does not have appointment available within that week where we can change this Unna boot. From observation of this wound, it appears as if this will need months of follow-up for granulated tissue to form in this area.I'm referring to him to a wound care clinic but of course advised him to return in one week if wound clinic does not have appointment available within that week where we can change this Unna boot. From observation of this wound, it appears as if this will need months of follow-up for granulated tissue to form in this area. -Will cover for mrsa at this time. - COMPLETE METABOLIC PANEL WITH GFR - Ambulatory referral to Wound Clinic - doxycycline (VIBRAMYCIN) 100 MG capsule; Take 1 capsule (100 mg total) by mouth 2 (two) times daily.  Dispense: 14 capsule; Refill: 0  2. Chronic venous insufficiency - Ambulatory referral to Wound Clinic - Ambulatory referral to Nutrition and  Diabetic Education - doxycycline (VIBRAMYCIN) 100 MG capsule; Take 1 capsule (100 mg total) by mouth 2 (two) times daily.  Dispense: 14 capsule; Refill: 0  3. Morbid obesity Spent generous time >20 discussing appropriate diet, cardiovascular risk of central obesity.  And encouraging weight loss.  I'm referring him to a nutritionist at this time. I would like to get him motivated to lose this weight for better outcome for him.  - COMPLETE METABOLIC PANEL WITH GFR - Ambulatory referral to Wound Clinic - Ambulatory referral to Nutrition and Diabetic Education  4. Screening for diabetes mellitus Discussed prediabetic A1c as catalyst for weight loss, but also needed in review for wound care efficacy. - POCT glucose (manual entry) - POCT glycosylated hemoglobin (Hb A1C)

## 2014-10-06 ENCOUNTER — Ambulatory Visit (INDEPENDENT_AMBULATORY_CARE_PROVIDER_SITE_OTHER): Payer: BLUE CROSS/BLUE SHIELD | Admitting: Physician Assistant

## 2014-10-06 VITALS — BP 132/70 | HR 87 | Temp 98.1°F | Resp 18 | Ht 66.5 in | Wt 381.0 lb

## 2014-10-06 DIAGNOSIS — I83029 Varicose veins of left lower extremity with ulcer of unspecified site: Secondary | ICD-10-CM

## 2014-10-06 DIAGNOSIS — I87312 Chronic venous hypertension (idiopathic) with ulcer of left lower extremity: Secondary | ICD-10-CM | POA: Diagnosis not present

## 2014-10-06 DIAGNOSIS — L97929 Non-pressure chronic ulcer of unspecified part of left lower leg with unspecified severity: Secondary | ICD-10-CM

## 2014-10-06 DIAGNOSIS — B351 Tinea unguium: Secondary | ICD-10-CM

## 2014-10-06 DIAGNOSIS — I872 Venous insufficiency (chronic) (peripheral): Secondary | ICD-10-CM | POA: Diagnosis not present

## 2014-10-06 DIAGNOSIS — I83008 Varicose veins of unspecified lower extremity with ulcer other part of lower leg: Secondary | ICD-10-CM | POA: Diagnosis not present

## 2014-10-06 DIAGNOSIS — L03116 Cellulitis of left lower limb: Secondary | ICD-10-CM

## 2014-10-06 MED ORDER — FUROSEMIDE 40 MG PO TABS: 40.0000 mg | ORAL_TABLET | Freq: Every day | ORAL | Status: DC

## 2014-10-06 MED ORDER — TERBINAFINE HCL 250 MG PO TABS: 250.0000 mg | ORAL_TABLET | Freq: Every day | ORAL | Status: DC

## 2014-10-06 NOTE — Patient Instructions (Addendum)
Please come back to see [place] tomorrow for a wound check.  We'll take the wet to dry dressing off and see how it looks.  Please take the lasix once daily to help with the fluid.  Be sure to elevate your legs at night and go to a medical supply store to pick up compression stockings.  It is important to wear these every day while your seated or on your feet.  Please schedule yourself to get a complete physical and a new primary care MD.  Wound care will be in touch with you to get scheduled asap. We have scheduled you for an echocardiogram of your heart and they will let you know when this is.  Please take the terbinafine once daily for 12 weeks for the toenail fungus.

## 2014-10-06 NOTE — Progress Notes (Signed)
Subjective:    Patient ID: Bruce Pace, male    DOB: 05-Apr-1960, 54 y.o.   MRN: [id number]  Chief Complaint  Patient presents with  . Wound Check    on leg   Medications, allergies, past medical history, surgical history, family history, social history and problem list reviewed and updated.  HPI  54 yom returns for wound check.   Hx chronic venous insuff. Last seen by [place] one week ago for chronic insuff and several LE ulcerations. Started on doxy. UNNA boots applied bilaterally. Wound care consult placed and pt instructed to f/u with [place] if he did not hear from wound care after few days.   Returns today one week later, has not heard from [facility]. Has been doing well since last visit. Taking doxy as prescribed. UNNA boots still in place. Denies fevers.   States he does not wear compression stockings and never has. Has trouble getting them on. He is married, lives with wife and states she may be willing to put them on/off for him. Has taken lasix in the past but not currently. Does not have a pcp. Denies cp, unusual sob. Denies orthopnea.   Also mentions would like medicine for toenail fungus.            Review of Systems See HPI.     Objective:   Physical Exam  Constitutional: He is oriented to person, place, and time. He appears well-developed and well-nourished.  Non-toxic appearance. He does not have a sickly appearance. He does not appear ill. No distress.  BP 132/70 mmHg  Pulse 87  Temp(Src) 98.1 F (36.7 C) (Oral)  Resp 18  Ht 5' 6.5" (1.689 m)  Wt 381 lb (172.82 kg)  BMI 60.58 kg/m2  SpO2 98%   Cardiovascular:  Pulses:      Dorsalis pedis pulses are 2+ on the right side, and 2+ on the left side.       Posterior tibial pulses are 2+ on the right side, and 2+ on the left side.  Neurological: He is alert and oriented to person, place, and time.  Skin:  UNNA boots removed. Large amnt fluid redistribution present with large amnt edema superior to UNNA boots bilaterally.  Left LE medial ulceration appears to have expanded and is now likely stage 3 with beefy red granulation tissue along with moderate amnt slough and eschar which was debrided. Ulcer is 5.5cm length x2.5-3cm width. No muscle or bone visible. Very small stage 2 ulcer left medial LE.           Assessment & Plan:   Toenail fungus - Plan: terbinafine (LAMISIL) 250 MG tablet --cmp normal last week, lamisil for 12 wks  Chronic venous insufficiency - Plan: Echocardiogram, furosemide (LASIX) 40 MG tablet Venous stasis ulcer of left lower extremity --suspect venous insuff as cause of edema and ulcerations as ulcerations are medial and has good dp/pt pulses bilaterally --unna boots removed, left LE ulceration debrided and cleaned, wet to dry dressing placed left LE ulceration, nonstick dressing placed over small right LE ulceration --rtc 24 hrs for recheck with dr Conley Rolls  --spoke with referrals who will attempt to get pt in with wound care asap --leg elevation while home, encouraged purchasing stockings to wear, pt agreeable --pt will be setting up for a complete physical exam visit to establish care at 104 in the near future --refilled lasix qd --referred for echocardiogram  Donnajean Lopes, PA-C Physician Assistant-Certified [facility]  10/06/2014 11:15 AM

## 2014-10-28 ENCOUNTER — Ambulatory Visit (INDEPENDENT_AMBULATORY_CARE_PROVIDER_SITE_OTHER): Payer: BLUE CROSS/BLUE SHIELD | Admitting: Neurology

## 2014-10-28 ENCOUNTER — Encounter: Payer: Self-pay | Admitting: Neurology

## 2014-10-28 VITALS — BP 110/52 | HR 78 | Resp 18 | Ht 66.5 in | Wt 375.0 lb

## 2014-10-28 DIAGNOSIS — G4719 Other hypersomnia: Secondary | ICD-10-CM | POA: Diagnosis not present

## 2014-10-28 DIAGNOSIS — R351 Nocturia: Secondary | ICD-10-CM | POA: Diagnosis not present

## 2014-10-28 DIAGNOSIS — R635 Abnormal weight gain: Secondary | ICD-10-CM

## 2014-10-28 DIAGNOSIS — R600 Localized edema: Secondary | ICD-10-CM

## 2014-10-28 DIAGNOSIS — G4733 Obstructive sleep apnea (adult) (pediatric): Secondary | ICD-10-CM

## 2014-10-28 DIAGNOSIS — Z9989 Dependence on other enabling machines and devices: Secondary | ICD-10-CM

## 2014-10-28 NOTE — Patient Instructions (Signed)
You have obstructive sleep apnea. I think we should proceed with a sleep study to determine how severe it is and if you need a change in your CPAP settings.  Please remember, the risks and ramifications of moderate to severe obstructive sleep apnea or OSA are: Cardiovascular disease, including congestive heart failure, stroke, difficult to control hypertension, arrhythmias, and even type 2 diabetes has been linked to untreated OSA. Sleep apnea causes disruption of sleep and sleep deprivation in most cases, which, in turn, can cause recurrent headaches, problems with memory, mood, concentration, focus, and vigilance. Most people with untreated sleep apnea report excessive daytime sleepiness, which can affect their ability to drive. Please do not drive if you feel sleepy.   I will likely see you back after your sleep study to go over the test results and where to go from there. We will call you after your sleep study to advise about the results (most likely, you will hear from Lafonda Mosses, my nurse) and to set up an appointment at the time, as necessary.    Our sleep lab administrative assistant, Alvis Lemmings will meet with you or call you to schedule your sleep study. If you don't hear back from her by next week please feel free to call her at [phone]. This is her direct line and please leave a message with your phone number to call back if you get the voicemail box. She will call back as soon as possible.

## 2014-10-28 NOTE — Progress Notes (Signed)
Subjective:    Patient ID: LUCIO LITSEY is a 54 y.o. male.  HPI     Huston Foley, MD, PhD [facility] [address], [address] [address] [place], [place] [address]  Dear Judeth Cornfield,   I saw your patient, Bernice Mullin, upon your kind request in my neurologic clinic today for initial consultation of his sleep disorder, in particular, reevaluation of his prior diagnosis of OSA. The patient is unaccompanied today. As you know, Mr. Hinchliffe is a 54 year old right-handed gentleman with an underlying medical history of chronic venous stasis complicated by recurrent dermatitis of both lower extremities, and morbid obesity, who was previously diagnosed with obstructive sleep apnea and placed on CPAP therapy. His prior sleep test results are not available for my review today. He has not had a mask replacement in years. He has not had reevaluation in years. He reports daytime sleepiness He has been compliant with treatment, but has skipped at times, noticing poor sleep quality, apneas, and gasping sensation. He has gained weight since his original sleep study some 10 years, in the realm of 40 lb. He has no significant RLS symptoms, but has numbness of his right thigh. He sleeps on his right side mostly. He did not bring his CPAP machine. It is about 77-11 years old. He is not sure what pressure he is on. He reports daytime somnolence. His Epworth sleepiness score is 14 out of 24, his fatigue scores 36 out of 63. He goes to bed around 11 and rise time is around 6. He does not always wake up rested. He works as a Education administrator. He has become drowsy while driving. He lives with his wife. She works at night. He has 2 children both living at home ages 63 and 40. He drinks caffeine in the form of cola, 2 per day, alcohol occasionally, he is a nonsmoker. He was supposed to see a vascular specialist but has not heard back for an appointment. He has nocturia about once per night.  His Past Medical History  Is Significant For: Past Medical History  Diagnosis Date  . Sleep apnea     His Past Surgical History Is Significant For: Past Surgical History  Procedure Laterality Date  . Hernia repair      His Family History Is Significant For: Family History  Problem Relation Age of Onset  . Stroke Mother     His Social History Is Significant For: Social History   Social History  . Marital Status: Married    Spouse Name: N/A  . Number of Children: 2  . Years of Education: N/A   Occupational History  . [name]     Social History Main Topics  . Smoking status: Never Smoker   . Smokeless tobacco: None  . Alcohol Use: No  . Drug Use: No  . Sexual Activity: Not Asked   Other Topics Concern  . None   Social History Narrative   Drinks about 1-2 cups of soda a day, occasionally drinks coffee     His Allergies Are:  No Known Allergies:   His Current Medications Are:  Outpatient Encounter Prescriptions as of 10/28/2014  Medication Sig  . acetaminophen (TYLENOL) 500 MG tablet Take 500 mg by mouth every 6 (six) hours as needed.  . furosemide (LASIX) 40 MG tablet Take 1 tablet (40 mg total) by mouth daily.  Marland Kitchen ibuprofen (ADVIL,MOTRIN) 200 MG tablet Take 200 mg by mouth every 6 (six) hours as needed.  . terbinafine (LAMISIL) 250  MG tablet Take 1 tablet (250 mg total) by mouth daily.   No facility-administered encounter medications on file as of 10/28/2014.  :  Review of Systems:  Out of a complete 14 point review of systems, all are reviewed and negative with the exception of these symptoms as listed below:   Review of Systems  Cardiovascular: Positive for leg swelling.  Musculoskeletal:       Joint pain   Neurological:       Last sleep study 4-5 years ago. Patient currently uses CPAP. Reports that he need new supplies. Wakes up feeling tired in the morning. Snoring. Apnea. Denies taking naps during day.  Reports that he has used CPAP since 2006.     Objective:  Neurologic  Exam  Physical Exam Physical Examination:   Filed Vitals:   10/28/14 1343  BP: 110/52  Pulse: 78  Resp: 18   General Examination: The patient is a very pleasant 54 y.o. male in no acute distress. He is morbidly obese.    HEENT: Normocephalic, atraumatic, pupils are equal, round and reactive to light and accommodation. Funduscopic exam is normal with sharp disc margins noted. Extraocular tracking is good without limitation to gaze excursion or nystagmus noted. Normal smooth pursuit is noted. Hearing is grossly intact. Tympanic membranes are clear bilaterally. Face is symmetric with normal facial animation and normal facial sensation. Speech is clear with no dysarthria noted. There is no hypophonia. There is no lip, neck/head, jaw or voice tremor. Neck is supple with full range of passive and active motion. There are no carotid bruits on auscultation. Oropharynx exam reveals: mild mouth dryness, good dental hygiene and moderate airway crowding, due to larger uvula, thicker tongue, narrow airway entry, tonsils. Mallampati is class II. Tongue protrudes centrally and palate elevates symmetrically. Neck size is 19.5 in.  Chest: Clear to auscultation without wheezing, rhonchi or crackles noted.  Heart: S1+S2+0, regular and normal without murmurs, rubs or gallops noted.   Abdomen: Soft, non-tender and non-distended with normal bowel sounds appreciated on auscultation.  Extremities: There is 4+ pitting edema in the distal lower extremities bilaterally with oozing noted and an ulcer in the medial aspect of the L shin.   Skin: Warm and dry without trophic changes noted in the UEs, but chronic discoloration noted in both LEs up to mid shin.   Musculoskeletal: exam reveals no obvious joint deformities, tenderness or joint swelling or erythema.   Neurologically:  Mental status: The patient is awake, alert and oriented in all 4 spheres. His immediate and remote memory, attention, language skills and  fund of knowledge are appropriate. There is no evidence of aphasia, agnosia, apraxia or anomia. Speech is clear with normal prosody and enunciation. Thought process is linear. Mood is normal and affect is normal.  Cranial nerves II - XII are as described above under HEENT exam. In addition: shoulder shrug is normal with equal shoulder height noted. Motor exam: Normal bulk, strength and tone is noted. There is no drift, tremor or rebound. Romberg is negative. Reflexes are 2+ throughout. Fine motor skills and coordination: intact with normal finger taps, normal hand movements, normal rapid alternating patting, normal foot taps and normal foot agility.  Cerebellar testing: No dysmetria or intention tremor on finger to nose testing. Heel to shin is unremarkable bilaterally. There is no truncal or gait ataxia.  Sensory exam: intact in the upper and lower extremities.  Gait, station and balance: He stands with difficulty. No veering to one side is noted. No  leaning to one side is noted. Posture is age-appropriate and stance is narrow based. Gait shows slow gait, due to body habitus, tandem walk not possible.                Assessment and Plan:   In summary, BLU LORI is a very pleasant 54 y.o.-year old male with an underlying medical history of chronic venous stasis complicated by recurrent dermatitis of both lower extremities, and morbid obesity, who was previously diagnosed with obstructive sleep apnea. He has had interim weight gain. He needs reevaluation.  I had a long chat with the patient about my findings and the diagnosis of OSA, its prognosis and treatment options. We talked about medical treatments, surgical interventions and non-pharmacological approaches. I explained in particular the risks and ramifications of untreated moderate to severe OSA, especially with respect to developing cardiovascular disease down the Road, including congestive heart failure, difficult to treat hypertension, cardiac  arrhythmias, or stroke. Even type 2 diabetes has, in part, been linked to untreated OSA. Symptoms of untreated OSA include daytime sleepiness, memory problems, mood irritability and mood disorder such as depression and anxiety, lack of energy, as well as recurrent headaches, especially morning headaches. We talked about trying to maintain a healthy lifestyle in general, as well as the importance of weight control. I encouraged the patient to eat healthy, exercise daily and keep well hydrated, to keep a scheduled bedtime and wake time routine, to not skip any meals and eat healthy snacks in between meals. I advised the patient not to drive when feeling sleepy. I recommended the following at this time: sleep study with potential positive airway pressure titration. (We will score hypopneas at 3% and split the sleep study into diagnostic and treatment portion, if the estimated. 2 hour AHI is >15/h).   He is supposed to see a vascular specialist for his left leg stasis ulcer, but has not been seen yet. I believe he was referred to wound care but has not heard back.  I explained the sleep test procedure to the patient and also outlined possible surgical and non-surgical treatment options of OSA, including the use of a custom-made dental device (which would require a referral to a specialist dentist or oral surgeon), upper airway surgical options, such as pillar implants, radiofrequency surgery, tongue base surgery, and UPPP (which would involve a referral to an ENT surgeon). Rarely, jaw surgery such as mandibular advancement may be considered.  I also explained the CPAP treatment option to the patient, who indicated that he would be willing to continue with CPAP the need arises. I explained the importance of being compliant with PAP treatment, not only for insurance purposes but primarily to improve His symptoms, and for the patient's long term health benefit, including to reduce His cardiovascular risks. I  answered all his questions today and the patient was in agreement. I would like to see him back after the sleep study is completed and encouraged him to call with any interim questions, concerns, problems or updates.   Thank you very much for allowing me to participate in the care of this nice patient. If I can be of any further assistance to you please do not hesitate to call me at [phone].  Sincerely,   Huston Foley, MD, PhD

## 2014-11-02 ENCOUNTER — Ambulatory Visit (INDEPENDENT_AMBULATORY_CARE_PROVIDER_SITE_OTHER): Payer: BLUE CROSS/BLUE SHIELD | Admitting: Emergency Medicine

## 2014-11-02 VITALS — BP 128/76 | HR 92 | Temp 98.9°F | Resp 18 | Ht 67.0 in | Wt 375.0 lb

## 2014-11-02 DIAGNOSIS — I872 Venous insufficiency (chronic) (peripheral): Secondary | ICD-10-CM | POA: Diagnosis not present

## 2014-11-02 DIAGNOSIS — I83029 Varicose veins of left lower extremity with ulcer of unspecified site: Secondary | ICD-10-CM | POA: Diagnosis not present

## 2014-11-02 DIAGNOSIS — B029 Zoster without complications: Secondary | ICD-10-CM | POA: Diagnosis not present

## 2014-11-02 DIAGNOSIS — L97929 Non-pressure chronic ulcer of unspecified part of left lower leg with unspecified severity: Secondary | ICD-10-CM

## 2014-11-02 MED ORDER — GABAPENTIN 300 MG PO CAPS: ORAL_CAPSULE | ORAL | Status: DC

## 2014-11-02 MED ORDER — VALACYCLOVIR HCL 1 G PO TABS: 1000.0000 mg | ORAL_TABLET | Freq: Three times a day (TID) | ORAL | Status: DC

## 2014-11-02 NOTE — Patient Instructions (Signed)

## 2014-11-02 NOTE — Progress Notes (Signed)
Subjective:  This chart was scribed for Bruce Pace, by [name], at [facility].  This patient was seen in room 12 and the patient's care was started at 12:34 PM.   Chief Complaint  Patient presents with  . Rash    left leg   . Wound Infection    left leg since last week   . Fever  . Generalized Body Aches     Patient ID: Bruce Pace, male    DOB: 1960/12/02, 54 y.o.   MRN: [id number]  HPI HPI Comments: Bruce Pace is a 54 y.o. male with a history of venous stasis disease who presents to the [facility] complaining of a rash on his left upper arm onset seven days ago.  Patient has associated symptoms of a burning sensation, fever, as well as arm pain radiating to his hand.  He has only taken ibuprofen to alleviate his pain.  Patient is a Education administrator.    Leg wound: Patient is also complaining of a wound on his left leg.  He notes that he cleans the wound with soap and water two times a day and wears stockings. Patient was told that he was going to be called regarding the ulcers on his legs but was never called.   Patient Active Problem List   Diagnosis Date Noted  . Morbid obesity (HCC) 08/13/2014  . Obstructive sleep apnea 02/06/2013  . Chronic venous stasis dermatitis of both lower extremities 02/06/2013   Past Medical History  Diagnosis Date  . Sleep apnea    Past Surgical History  Procedure Laterality Date  . Hernia repair     No Known Allergies Prior to Admission medications   Medication Sig Start Date End Date Taking? Authorizing Provider  acetaminophen (TYLENOL) 500 MG tablet Take 500 mg by mouth every 6 (six) hours as needed.   Yes Historical Provider, Pace  furosemide (LASIX) 40 MG tablet Take 1 tablet (40 mg total) by mouth daily. Patient not taking: Reported on 11/02/2014 10/06/14   [name], PA  ibuprofen (ADVIL,MOTRIN) 200 MG tablet Take 200 mg by mouth every 6 (six) hours as needed.    Historical  Provider, Pace  terbinafine (LAMISIL) 250 MG tablet Take 1 tablet (250 mg total) by mouth daily. Patient not taking: Reported on 11/02/2014 10/06/14   [name], PA   Social History   Social History  . Marital Status: Married    Spouse Name: N/A  . Number of Children: 2  . Years of Education: N/A   Occupational History  . [name]     Social History Main Topics  . Smoking status: Never Smoker   . Smokeless tobacco: Not on file  . Alcohol Use: No  . Drug Use: No  . Sexual Activity: Not on file   Other Topics Concern  . Not on file   Social History Narrative   Drinks about 1-2 cups of soda a day, occasionally drinks coffee     Review of Systems  Constitutional: Positive for fever.  Eyes: Negative for pain, redness and itching.  Respiratory: Negative for choking.   Gastrointestinal: Negative for nausea and vomiting.  Musculoskeletal: Negative for neck pain and neck stiffness.  Skin: Positive for rash and wound.  Neurological: Negative for speech difficulty.       Objective:   Physical Exam  Filed Vitals:   11/02/14 1152  BP: 128/76  Pulse: 92  Temp: 98.9 F (37.2 C)  TempSrc: Oral  Resp: 18  Height:  (1.702 m)  Weight: 375 lb (170.099 kg)  SpO2: 96%    CONSTITUTIONAL: Well developed/well nourished HEAD: Normocephalic/atraumatic EYES: EOMI/PERRL NEURO: Pt is awake/alert/appropriate, moves all extremitiesx4.  No facial droop.   SKIN: he has a 2 by 4 cm open ulceration on the inside of the left ankle.  He has bilateral severe venous stasis disease.  PSYCH: no abnormalities of mood noted, alert and oriented to situation      Assessment & Plan:  Patient has a severe outbreak of shingles but it is now 46-week-old. I started him on Valtrex and Neurontin. He has severe bilateral venous stasis disease with a large venous stasis ulcer involving the medial left ankle. Referral made to [facility] for this. I will check the patient back on Sunday for follow-up. He was  given a note for 2 weeks.I personally performed the services described in this documentation, which was scribed in my presence. The recorded information has been reviewed and is accurate.

## 2014-11-06 ENCOUNTER — Other Ambulatory Visit: Payer: Self-pay | Admitting: Emergency Medicine

## 2014-11-06 DIAGNOSIS — B958 Unspecified staphylococcus as the cause of diseases classified elsewhere: Secondary | ICD-10-CM

## 2014-11-06 LAB — WOUND CULTURE
Gram Stain: NONE SEEN
Gram Stain: NONE SEEN

## 2014-11-06 MED ORDER — CEPHALEXIN 500 MG PO CAPS: 500.0000 mg | ORAL_CAPSULE | Freq: Three times a day (TID) | ORAL | Status: DC

## 2014-11-16 ENCOUNTER — Other Ambulatory Visit: Payer: Self-pay | Admitting: *Deleted

## 2014-11-16 DIAGNOSIS — I872 Venous insufficiency (chronic) (peripheral): Secondary | ICD-10-CM

## 2014-11-16 DIAGNOSIS — L97309 Non-pressure chronic ulcer of unspecified ankle with unspecified severity: Secondary | ICD-10-CM

## 2014-11-25 ENCOUNTER — Ambulatory Visit (INDEPENDENT_AMBULATORY_CARE_PROVIDER_SITE_OTHER): Payer: BLUE CROSS/BLUE SHIELD | Admitting: Neurology

## 2014-11-25 DIAGNOSIS — G479 Sleep disorder, unspecified: Secondary | ICD-10-CM

## 2014-11-25 DIAGNOSIS — G4734 Idiopathic sleep related nonobstructive alveolar hypoventilation: Secondary | ICD-10-CM

## 2014-11-25 DIAGNOSIS — G4733 Obstructive sleep apnea (adult) (pediatric): Secondary | ICD-10-CM

## 2014-11-26 NOTE — Sleep Study (Signed)
Please see the scanned sleep study interpretation located in the procedure tab in the chart view section.  

## 2014-12-01 ENCOUNTER — Telehealth: Payer: Self-pay | Admitting: Neurology

## 2014-12-01 DIAGNOSIS — G4734 Idiopathic sleep related nonobstructive alveolar hypoventilation: Secondary | ICD-10-CM

## 2014-12-01 DIAGNOSIS — G4733 Obstructive sleep apnea (adult) (pediatric): Secondary | ICD-10-CM

## 2014-12-01 NOTE — Telephone Encounter (Signed)
[name]:  Patient referred by Trena PlattStephanie English, seen by me on 10/28/14, split study on 11/25/14, Ins: [id number]. Please call and notify patient that the recent sleep study confirmed the diagnosis of severe OSA. He did well with CPAP during the study with significant improvement of the respiratory events. Therefore, I would like order a new CPAP machine for home use. I placed the order in the chart. The patient will need a follow up appointment with me in 8 to 10 weeks post set up that has to be scheduled; please go ahead and schedule while you have the patient on the phone and make sure patient understands the importance of keeping this window for the FU appointment, as it is often an insurance requirement and failing to adhere to this may result in losing coverage for sleep apnea treatment.  Please re-enforce the importance of compliance with treatment and the need for us to monitor compliance data - again an insurance requirement and good feedback for the patient as far as how they are doing.  Also remind patient, that any upcoming CPAP machine or mask issues, should be first addressed with the DME company. Please ask if patient has a preference regarding DME company.  Please arrange for CPAP set up at home through a DME company of patient's choice - once you have spoken to the patient - and faxed/routed report to PCP and referring MD (if other than PCP), you can close this encounter, thanks,   Huston FoleySaima Symphonie Schneiderman, MD, PhD [facility]

## 2014-12-03 NOTE — Telephone Encounter (Signed)
I spoke to patient and he is aware of results and recommendation. He would like to proceed with treatment. I will refer to [facility]. He made f/u appt. I will send him a letter reminding him to keep appt and stress importance of compliance. I will fax report to PCP.

## 2014-12-15 ENCOUNTER — Encounter: Payer: Self-pay | Admitting: Vascular Surgery

## 2014-12-22 ENCOUNTER — Ambulatory Visit (INDEPENDENT_AMBULATORY_CARE_PROVIDER_SITE_OTHER): Payer: BLUE CROSS/BLUE SHIELD | Admitting: Vascular Surgery

## 2014-12-22 ENCOUNTER — Ambulatory Visit (HOSPITAL_COMMUNITY)
Admission: RE | Admit: 2014-12-22 | Discharge: 2014-12-22 | Disposition: A | Discharge status: Home / Self Care | Payer: BLUE CROSS/BLUE SHIELD | Source: Ambulatory Visit | Attending: Vascular Surgery | Admitting: Vascular Surgery

## 2014-12-22 ENCOUNTER — Encounter: Payer: Self-pay | Admitting: Vascular Surgery

## 2014-12-22 VITALS — BP 106/65 | HR 79 | Temp 98.0°F | Resp 16 | Ht 66.0 in | Wt 366.0 lb

## 2014-12-22 DIAGNOSIS — I872 Venous insufficiency (chronic) (peripheral): Secondary | ICD-10-CM | POA: Insufficient documentation

## 2014-12-22 DIAGNOSIS — I83893 Varicose veins of bilateral lower extremities with other complications: Secondary | ICD-10-CM | POA: Insufficient documentation

## 2014-12-22 DIAGNOSIS — L97309 Non-pressure chronic ulcer of unspecified ankle with unspecified severity: Secondary | ICD-10-CM | POA: Diagnosis not present

## 2014-12-22 NOTE — Progress Notes (Signed)
Subjective:     Patient ID: Bruce Pace, male   DOB: 06-11-1960, 54 y.o.   MRN: [id number]  HPI  This 54 year old male is evaluated for severe venous insufficiency with extensive venous ulcers bilaterally. He has been to the wound center on 1 occasion for ulcer on the left leg which was about 6 or 9 months ago but never returned. He does not really last to compressions because he is morbidly obese and cannot applied the compression stockings. He has no history of DVT thrombophlebitis stasis ulcers or bleeding. He had shingles in his left upper chest area about 4-5 weeks ago and shortly thereafter developed multiple ulcers in the right medial ankle which have also come more extensive. He  Does not take anticoagulants.   Past Medical History  Diagnosis Date  . Sleep apnea   . Ulcer of leg, chronic (HCC)     Social History  Substance Use Topics  . Smoking status: Never Smoker   . Smokeless tobacco: Never Used  . Alcohol Use: No    Family History  Problem Relation Age of Onset  . Stroke Mother     No Known Allergies   Current outpatient prescriptions:  .  furosemide (LASIX) 40 MG tablet, Take 1 tablet (40 mg total) by mouth daily., Disp: 30 tablet, Rfl: 3 .  gabapentin (NEURONTIN) 300 MG capsule, Start with one tablet at night. If tolerated you can take 1 tablet in the morning and one tablet at night., Disp: 60 capsule, Rfl: 1 .  ibuprofen (ADVIL,MOTRIN) 200 MG tablet, Take 200 mg by mouth every 6 (six) hours as needed., Disp: , Rfl:  .  terbinafine (LAMISIL) 250 MG tablet, Take 1 tablet (250 mg total) by mouth daily., Disp: 30 tablet, Rfl: 2 .  valACYclovir (VALTREX) 1000 MG tablet, Take 1 tablet (1,000 mg total) by mouth 3 (three) times daily., Disp: 21 tablet, Rfl: 0 .  acetaminophen (TYLENOL) 500 MG tablet, Take 500 mg by mouth every 6 (six) hours as needed., Disp: , Rfl:  .  cephALEXin (KEFLEX) 500 MG capsule, Take 1 capsule (500 mg total) by mouth 3 (three) times daily. (Patient  not taking: Reported on 12/22/2014), Disp: 30 capsule, Rfl: 0  Filed Vitals:   12/22/14 1455  BP: 106/65  Pulse: 79  Temp: 98 F (36.7 C)  TempSrc: Oral  Resp: 16  Height:  (1.676 m)  Weight: 366 lb (166.017 kg)  SpO2: 95%    Body mass index is 59.1 kg/(m^2).          Review of Systems History of morbid obesity, sleep apnea, denies chest pain, dyspnea on exertion, PND, orthopnea, hemoptysis , claudication. Recent episode of shingles. See history of present illness     Objective:   Physical Exam BP 106/65 mmHg  Pulse 79  Temp(Src) 98 F (36.7 C) (Oral)  Resp 16  Ht  (1.676 m)  Wt 366 lb (166.017 kg)  BMI 59.10 kg/m2  SpO2 95%  Gen.-alert and oriented x3 in no apparent distress -morbidly obese HEENT normal for age Lungs no rhonchi or wheezing Cardiovascular regular rhythm no murmurs carotid pulses 3+ palpable no bruits audible Abdomen soft nontender no palpable masses -Morbidly obese Musculoskeletal free of  major deformities Skin clear -no rashes Neurologic normal Lower extremities 3+ femoral and dorsalis pedis pulses palpable  Bilaterally  left lower leg has severe hyperpigmentation over the distal half of the calf with chronic 1+ edema. Large necrotic ulceration medially measuring 4 x 3  cm over the great saphenous vein proximal to medial malleolus Right leg with similar hyperpigmentation and severe skin changes with 5 discrete ulcerations largest of which is 1.5 cm over the distal third of the medial calf. 1+ edema. 3+ dorsalis pedis pulse palpable bilaterally.   Today I ordered bilateral venous duplex exam which are reviewed and interpreted. There is no DVT. There is gross reflux in bilateral great saphenous veins with large caliber veins bilaterally. There is also deep vein reflux.      Assessment:      extensive limb threatening stasis ulcers bilaterally due to superficial gross reflux -venous and the gross reflux-venous with good arterial supply   Patient has morbid obesity and is not a candidate for  Long-leg compression stockings and currently has extensive ulcer on left leg which is limb threatening     Plan:      feel we need to proceed this and is possible with staged bilateral laser ablation of great saphenous veins to try to improve venous hypertension as much as feasible and then have patient followed in the wound center [facility] will proceed with precertification to perform this in the near future

## 2014-12-23 ENCOUNTER — Other Ambulatory Visit: Payer: Self-pay | Admitting: *Deleted

## 2014-12-23 DIAGNOSIS — I83019 Varicose veins of right lower extremity with ulcer of unspecified site: Secondary | ICD-10-CM

## 2014-12-23 DIAGNOSIS — I83029 Varicose veins of left lower extremity with ulcer of unspecified site: Secondary | ICD-10-CM

## 2014-12-23 DIAGNOSIS — L97929 Non-pressure chronic ulcer of unspecified part of left lower leg with unspecified severity: Secondary | ICD-10-CM

## 2014-12-23 DIAGNOSIS — L97919 Non-pressure chronic ulcer of unspecified part of right lower leg with unspecified severity: Secondary | ICD-10-CM

## 2015-01-06 ENCOUNTER — Encounter: Payer: Self-pay | Admitting: Vascular Surgery

## 2015-01-12 ENCOUNTER — Encounter: Payer: Self-pay | Admitting: Vascular Surgery

## 2015-01-12 ENCOUNTER — Other Ambulatory Visit: Payer: BLUE CROSS/BLUE SHIELD | Admitting: Vascular Surgery

## 2015-01-12 ENCOUNTER — Ambulatory Visit (INDEPENDENT_AMBULATORY_CARE_PROVIDER_SITE_OTHER): Payer: BLUE CROSS/BLUE SHIELD | Admitting: Vascular Surgery

## 2015-01-12 VITALS — BP 127/74 | HR 91 | Resp 18 | Ht 66.0 in | Wt 350.0 lb

## 2015-01-12 DIAGNOSIS — I83892 Varicose veins of left lower extremities with other complications: Secondary | ICD-10-CM | POA: Insufficient documentation

## 2015-01-12 NOTE — Progress Notes (Signed)
Laser Ablation Procedure    Date: 01/12/2015   Bruce LeanMario A Pace DOB:December 25, 1960  Consent signed: Yes    Surgeon:  Dr. Quita SkyeJames D. Hart RochesterLawson  Procedure: Laser Ablation: left Greater Saphenous Vein  There were no vitals taken for this visit.  Tumescent Anesthesia: 250 cc 0.9% NaCl with 50 cc Lidocaine HCL with 1% Epi and 15 cc 8.4% NaHCO3  Local Anesthesia: 4 cc Lidocaine HCL and NaHCO3 (ratio 2:1)  Pulsed Mode: 15 watts, 500ms delay, 1.0 duration  Total Energy:2515              Total Pulses: 169               Total Time: 2:48    Patient tolerated procedure well  Notes: Unna Boots applied bilateral  Description of Procedure:  After marking the course of the secondary varicosities, the patient was placed on the operating table in the supine position, and the left leg was prepped and draped in sterile fashion.   Local anesthetic was administered and under ultrasound guidance the saphenous vein was accessed with a micro needle and guide wire; then the mirco puncture sheath was placed.  A guide wire was inserted saphenofemoral junction , followed by a 5 french sheath.  The position of the sheath and then the laser fiber below the junction was confirmed using the ultrasound.  Tumescent anesthesia was administered along the course of the saphenous vein using ultrasound guidance. The patient was placed in Trendelenburg position and protective laser glasses were placed on patient and staff, and the laser was fired at 15 watts continuous mode advancing 1-122mm/second for a total of 2515 joules.     Steri strips were applied to the stab wounds and ABD pads and thigh high compression stockings were applied.  Ace wrap bandages were applied over the phlebectomy sites and at the top of the saphenofemoral junction. Blood loss was less than 15 cc.  The patient ambulated out of the operating room having tolerated the procedure well.

## 2015-01-12 NOTE — Progress Notes (Signed)
Subjective:     Patient ID: Bruce Pace, male   DOB: Dec 12, 1960, 54 y.o.   MRN: [id number]  HPI this 54 year old male had laser ablation of the left great saphenous vein from the proximal calf to near the saphenofemoral junction on under local tumescent anesthesia. He tolerated the procedure well. A total of 2515 J of energy was utilized. Review of Systems     Objective:   Physical Exam There were no vitals taken for this visit.       Assessment:     Well-tolerated laser ablation left great saphenous vein performed under local tumescent anesthesia for extensive stasis ulcer with gross reflux left great saphenous vein    Plan:     Patient will return on January 3 for venous duplex exam to confirm closure left great saphenous vein He will then have similar procedure and contralateral right leg on January 9

## 2015-01-13 ENCOUNTER — Telehealth: Payer: Self-pay | Admitting: *Deleted

## 2015-01-13 ENCOUNTER — Encounter: Payer: Self-pay | Admitting: Vascular Surgery

## 2015-01-13 NOTE — Telephone Encounter (Signed)
Pt having some discomfort. Biggest problem is the ace wraps which fall due and this is due to the size of his thighs. Discussed measures to deal with the wraps. He is going oot this week so we won't see him until 01/26/15. He will remove the unna boot next week.

## 2015-01-19 ENCOUNTER — Ambulatory Visit: Payer: BLUE CROSS/BLUE SHIELD | Admitting: Vascular Surgery

## 2015-01-19 ENCOUNTER — Encounter: Payer: Self-pay | Admitting: Vascular Surgery

## 2015-01-19 ENCOUNTER — Encounter (HOSPITAL_COMMUNITY): Payer: BLUE CROSS/BLUE SHIELD

## 2015-01-21 ENCOUNTER — Encounter: Payer: Self-pay | Admitting: Vascular Surgery

## 2015-01-26 ENCOUNTER — Encounter (HOSPITAL_COMMUNITY): Payer: BLUE CROSS/BLUE SHIELD

## 2015-01-26 ENCOUNTER — Ambulatory Visit: Payer: BLUE CROSS/BLUE SHIELD | Admitting: Vascular Surgery

## 2015-01-27 ENCOUNTER — Ambulatory Visit (HOSPITAL_COMMUNITY)
Admission: RE | Admit: 2015-01-27 | Discharge: 2015-01-27 | Disposition: A | Discharge status: Home / Self Care | Payer: BLUE CROSS/BLUE SHIELD | Source: Ambulatory Visit | Attending: Vascular Surgery | Admitting: Vascular Surgery

## 2015-01-27 DIAGNOSIS — L97929 Non-pressure chronic ulcer of unspecified part of left lower leg with unspecified severity: Secondary | ICD-10-CM

## 2015-01-27 DIAGNOSIS — I83029 Varicose veins of left lower extremity with ulcer of unspecified site: Secondary | ICD-10-CM | POA: Diagnosis not present

## 2015-01-28 ENCOUNTER — Encounter: Payer: Self-pay | Admitting: Family

## 2015-01-29 ENCOUNTER — Encounter: Payer: Self-pay | Admitting: Vascular Surgery

## 2015-02-01 ENCOUNTER — Other Ambulatory Visit: Payer: BLUE CROSS/BLUE SHIELD | Admitting: Vascular Surgery

## 2015-02-08 ENCOUNTER — Ambulatory Visit: Payer: BLUE CROSS/BLUE SHIELD | Admitting: Vascular Surgery

## 2015-02-08 ENCOUNTER — Encounter (HOSPITAL_COMMUNITY): Payer: BLUE CROSS/BLUE SHIELD

## 2015-02-10 ENCOUNTER — Telehealth: Payer: Self-pay

## 2015-02-10 ENCOUNTER — Ambulatory Visit: Payer: Self-pay | Admitting: Neurology

## 2015-02-10 NOTE — Telephone Encounter (Signed)
Pt was r/s for Jan 31st.

## 2015-02-10 NOTE — Telephone Encounter (Signed)
Dr. Frances Furbish has to leave early today. I LM for this patient to reschedule his appt. I will call back later if I see that he has not called back.

## 2015-02-11 ENCOUNTER — Ambulatory Visit (HOSPITAL_BASED_OUTPATIENT_CLINIC_OR_DEPARTMENT_OTHER): Payer: BLUE CROSS/BLUE SHIELD

## 2015-02-12 ENCOUNTER — Encounter: Payer: Self-pay | Admitting: Vascular Surgery

## 2015-02-22 ENCOUNTER — Encounter: Payer: Self-pay | Admitting: Vascular Surgery

## 2015-02-22 ENCOUNTER — Ambulatory Visit (INDEPENDENT_AMBULATORY_CARE_PROVIDER_SITE_OTHER): Payer: BLUE CROSS/BLUE SHIELD | Admitting: Vascular Surgery

## 2015-02-22 ENCOUNTER — Other Ambulatory Visit: Payer: BLUE CROSS/BLUE SHIELD | Admitting: Vascular Surgery

## 2015-02-22 VITALS — BP 127/80 | HR 73 | Temp 98.0°F | Resp 18 | Ht 66.0 in | Wt 355.0 lb

## 2015-02-22 DIAGNOSIS — I87013 Postthrombotic syndrome with ulcer of bilateral lower extremity: Secondary | ICD-10-CM

## 2015-02-22 DIAGNOSIS — L97919 Non-pressure chronic ulcer of unspecified part of right lower leg with unspecified severity: Secondary | ICD-10-CM

## 2015-02-22 DIAGNOSIS — I83009 Varicose veins of unspecified lower extremity with ulcer of unspecified site: Secondary | ICD-10-CM | POA: Insufficient documentation

## 2015-02-22 DIAGNOSIS — L97929 Non-pressure chronic ulcer of unspecified part of left lower leg with unspecified severity: Principal | ICD-10-CM

## 2015-02-22 DIAGNOSIS — L97909 Non-pressure chronic ulcer of unspecified part of unspecified lower leg with unspecified severity: Secondary | ICD-10-CM | POA: Insufficient documentation

## 2015-02-22 NOTE — Progress Notes (Signed)
Subjective:     Patient ID: Bruce Pace, male   DOB: January 28, 1960, 55 y.o.   MRN: [id number]  HPI this 55 year old male had laser ablation of the right great saphenous vein from the proximal calf to near the saphenofemoral junction performed under local tumescent anesthesia. A total of 2740 J of energy was utilized. He tolerated the procedure well. Review of Systems     Objective:   Physical Exam BP 127/80 mmHg  Pulse 73  Temp(Src) 98 F (36.7 C)  Resp 18  Ht  (1.676 m)  Wt 355 lb (161.027 kg)  BMI 57.33 kg/m2  SpO2 99%       Assessment:     Well-tolerated laser ablation right great saphenous vein performed under local tumescent anesthesia for a lateral venous stasis ulcers    Plan:     Return in 1 week for venous duplex exam to confirm closure right great saphenous vein Patient will then begin owing to the wound center for chronic wound care. We will apply bilateral Unna boots today and extrinsic compression dressing on right thigh since he has not candidate for long leg stocking.

## 2015-02-22 NOTE — Progress Notes (Signed)
Laser Ablation Procedure    Date: 02/22/2015   Bruce Pace DOB:04-20-1960  Consent signed: Yes    Surgeon:  Dr. Quita Skye. [facility] [place]  Procedure: Laser Ablation: right Greater Saphenous Vein  BP 127/80 mmHg  Pulse 73  Temp(Src) 98 F (36.7 C)  Resp 18  Ht  (1.676 m)  Wt 355 lb (161.027 kg)  BMI 57.33 kg/m2  SpO2 99%  Tumescent Anesthesia: 400 cc 0.9% NaCl with 50 cc Lidocaine HCL with 1% Epi and 15 cc 8.4% NaHCO3  Local Anesthesia: 5 cc Lidocaine HCL and NaHCO3 (ratio 2:1)  Pulsed Mode: 15 watts, delay, 1.0 duration  Total Energy: 2740             Total Pulses:  184              Total Time: 3:03    Patient tolerated procedure well  Notes: Unna Boots bilat and ace wrap  Description of Procedure:  After marking the course of the secondary varicosities, the patient was placed on the operating table in the supine position, and the right leg was prepped and draped in sterile fashion.   Local anesthetic was administered and under ultrasound guidance the saphenous vein was accessed with a micro needle and guide wire; then the mirco puncture sheath was placed.  A guide wire was inserted saphenofemoral junction , followed by a 5 french sheath.  The position of the sheath and then the laser fiber below the junction was confirmed using the ultrasound.  Tumescent anesthesia was administered along the course of the saphenous vein using ultrasound guidance. The patient was placed in Trendelenburg position and protective laser glasses were placed on patient and staff, and the laser was fired at 15 watts continuous mode advancing 1-5mm/second for a total of 2740 joules.     Steri strips were applied to the stab wounds and ABD pads and thigh high compression stockings were applied.  Ace wrap bandages were applied over the phlebectomy sites and at the top of the saphenofemoral junction. Blood loss was less than 15 cc.  The patient ambulated out of the operating room having tolerated the  procedure well.

## 2015-02-23 ENCOUNTER — Encounter: Payer: Self-pay | Admitting: Vascular Surgery

## 2015-02-23 ENCOUNTER — Telehealth: Payer: Self-pay

## 2015-02-23 ENCOUNTER — Ambulatory Visit: Payer: BLUE CROSS/BLUE SHIELD | Admitting: Neurology

## 2015-02-23 NOTE — Telephone Encounter (Signed)
Pt doing ok. Some discomfort. Following all instructions.

## 2015-02-23 NOTE — Telephone Encounter (Signed)
Patient did not show to appt.  

## 2015-03-01 ENCOUNTER — Ambulatory Visit (INDEPENDENT_AMBULATORY_CARE_PROVIDER_SITE_OTHER): Payer: BLUE CROSS/BLUE SHIELD | Admitting: Vascular Surgery

## 2015-03-01 ENCOUNTER — Encounter: Payer: Self-pay | Admitting: Neurology

## 2015-03-01 ENCOUNTER — Ambulatory Visit (INDEPENDENT_AMBULATORY_CARE_PROVIDER_SITE_OTHER): Payer: BLUE CROSS/BLUE SHIELD | Admitting: Neurology

## 2015-03-01 ENCOUNTER — Encounter: Payer: Self-pay | Admitting: Vascular Surgery

## 2015-03-01 ENCOUNTER — Other Ambulatory Visit: Payer: Self-pay | Admitting: Vascular Surgery

## 2015-03-01 ENCOUNTER — Ambulatory Visit (HOSPITAL_COMMUNITY)
Admission: RE | Admit: 2015-03-01 | Discharge: 2015-03-01 | Disposition: A | Discharge status: Home / Self Care | Payer: BLUE CROSS/BLUE SHIELD | Source: Ambulatory Visit | Attending: Vascular Surgery | Admitting: Vascular Surgery

## 2015-03-01 VITALS — BP 127/72 | HR 76 | Resp 18 | Ht 66.0 in | Wt 351.0 lb

## 2015-03-01 VITALS — BP 107/64 | HR 80 | Temp 98.3°F | Resp 16 | Ht 66.0 in | Wt 355.0 lb

## 2015-03-01 DIAGNOSIS — R600 Localized edema: Secondary | ICD-10-CM

## 2015-03-01 DIAGNOSIS — I8393 Asymptomatic varicose veins of bilateral lower extremities: Secondary | ICD-10-CM

## 2015-03-01 DIAGNOSIS — L97929 Non-pressure chronic ulcer of unspecified part of left lower leg with unspecified severity: Secondary | ICD-10-CM

## 2015-03-01 DIAGNOSIS — Z9889 Other specified postprocedural states: Secondary | ICD-10-CM | POA: Diagnosis not present

## 2015-03-01 DIAGNOSIS — Z9989 Dependence on other enabling machines and devices: Secondary | ICD-10-CM

## 2015-03-01 DIAGNOSIS — G4734 Idiopathic sleep related nonobstructive alveolar hypoventilation: Secondary | ICD-10-CM

## 2015-03-01 DIAGNOSIS — I83029 Varicose veins of left lower extremity with ulcer of unspecified site: Secondary | ICD-10-CM

## 2015-03-01 DIAGNOSIS — G4733 Obstructive sleep apnea (adult) (pediatric): Secondary | ICD-10-CM

## 2015-03-01 DIAGNOSIS — I83891 Varicose veins of right lower extremities with other complications: Secondary | ICD-10-CM

## 2015-03-01 NOTE — Patient Instructions (Addendum)
Please continue using your CPAP regularly. While your insurance requires that you use CPAP at least 4 hours each night on 70% of the nights, I recommend, that you not skip any nights and use it throughout the night if you can. Getting used to CPAP and staying with the treatment long term does take time and patience and discipline. Untreated obstructive sleep apnea when it is moderate to severe can have an adverse impact on cardiovascular health and raise her risk for heart disease, arrhythmias, hypertension, congestive heart failure, stroke and diabetes. Untreated obstructive sleep apnea causes sleep disruption, nonrestorative sleep, and sleep deprivation. This can have an impact on your day to day functioning and cause daytime sleepiness and impairment of cognitive function, memory loss, mood disturbance, and problems focussing. Using CPAP regularly can improve these symptoms.  We will do an overnight oxygen level test, called ONO, and your DME company will call and set this up for one night, while you also use your CPAP. We will call you with the results.

## 2015-03-01 NOTE — Progress Notes (Signed)
Subjective:     Patient ID: Bruce Pace, male   DOB: 1960/11/04, 55 y.o.   MRN: [id number]  HPI this 55 year old male returns 1 week post-laser ablation right great saphenous vein for gross reflux with a history of recurrent venous stasis ulcers right leg. He tolerated the procedure well. He has worn compression wraps since the procedure last week. He is to begin going to the wound center after today's appointment. He is morbidly obese. He has not been to the wound center recently. He states that the swelling in the right leg is clearly better than it was prior to the procedure and he is having less discomfort in the ulcerations have begin to "dry up". Past Medical History  Diagnosis Date  . Sleep apnea   . Ulcer of leg, chronic (HCC)     Social History  Substance Use Topics  . Smoking status: Never Smoker   . Smokeless tobacco: Never Used  . Alcohol Use: No    Family History  Problem Relation Age of Onset  . Stroke Mother     No Known Allergies   Current outpatient prescriptions:  .  acetaminophen (TYLENOL) 500 MG tablet, Take 500 mg by mouth every 6 (six) hours as needed., Disp: , Rfl:  .  cephALEXin (KEFLEX) 500 MG capsule, Take 1 capsule (500 mg total) by mouth 3 (three) times daily., Disp: 30 capsule, Rfl: 0 .  furosemide (LASIX) 40 MG tablet, Take 1 tablet (40 mg total) by mouth daily., Disp: 30 tablet, Rfl: 3 .  gabapentin (NEURONTIN) 300 MG capsule, Start with one tablet at night. If tolerated you can take 1 tablet in the morning and one tablet at night., Disp: 60 capsule, Rfl: 1 .  ibuprofen (ADVIL,MOTRIN) 200 MG tablet, Take 200 mg by mouth every 6 (six) hours as needed., Disp: , Rfl:  .  terbinafine (LAMISIL) 250 MG tablet, Take 1 tablet (250 mg total) by mouth daily., Disp: 30 tablet, Rfl: 2 .  valACYclovir (VALTREX) 1000 MG tablet, Take 1 tablet (1,000 mg total) by mouth 3 (three) times daily., Disp: 21 tablet, Rfl: 0  There were no vitals filed for this  visit.  There is no weight on file to calculate BMI.         Review of Systems denies chest pain, dyspnea on exertion, PND, orthopnea, hemoptysis     Objective:   Physical Exam There were no vitals taken for this visit.  Enteral morbidly obese male no apparent distress alert and oriented 3 Lungs no rhonchi or wheezing Right leg with 3 ulcerations medially lower third lower leg all about 1 cm in diameter with dry eschar. 2+ dorsalis pedis pulse palpable. Severe hyperpigmentation lower third right leg.  Today in order venous duplex exam of the right leg which I reviewed and interpreted. There is no DVT. There is total closure of the right great saphenous vein from the proximal calf to near the saphenofemoral junction.     Assessment:     Successful laser ablation right great saphenous vein in patient with recurrent venous stasis ulcers. No evidence of DVT.    Plan:     #1 we will reapply Unna boot today #2 patient to go to wound center later this week-will try and make him an appointment #3 patient needs to wear short leg elastic compression stockings on a chronic basis following healing of this ulceration Return to see [place] on when necessary basis

## 2015-03-01 NOTE — Progress Notes (Signed)
Subjective:    Patient ID: Bruce Pace is a 55 y.o. male.  HPI     Interim history:   Mr. Bruce Pace is a 55 year old right-handed gentleman with an underlying medical history of chronic venous stasis complicated by recurrent dermatitis of both lower extremities, and morbid obesity, who presents for follow-up consultation of his obstructive sleep apnea, after his recent split-night sleep study. Of note, the patient missed an appointment on 02/23/2015. The patient is unaccompanied today. I first met him on 10/28/2014 at the request of his primary care provider, at which time he reported a prior diagnosis of OSA. He needed reevaluation and also reported interim weight gain. He had a split-night sleep study on 11/25/2014. I went over his test results with him in detail today. Sleep efficiency at baseline was 87.2% with a latency to sleep of 7.5 minutes and wake after sleep onset of 13 minutes with mild sleep fragmentation noted. He had a mildly elevated arousal index. He had an increased percentage of stage II sleep, 9% of slow-wave sleep and REM sleep at 20.8% with a normal REM latency. He had no significant PLMS, EKG or EEG changes. Moderate to loud snoring was noted. He slept only on his back. Total AHI was 49.5 per hour, average oxygen saturation 91%, nadir was 73% in REM sleep. He was then titrated on CPAP. Sleep efficiency was 77.7% with a sleep latency of 33.5 minutes and wake after sleep onset of 27 minutes with mild to moderate sleep fragmentation noted. He achieved 16.8% of slow-wave sleep and 25.1% of REM sleep. Average oxygen saturation was 91%, nadir was 81%. No significant PLMS were noted during the treatment portion of the study. CPAP was titrated from 5 cm to 13 cm. AHI was 7.4 per hour at the final pressure with supine REM sleep achieved. Based on his test results and residual AHI of 7.4 per hour on the final pressure recommended home CPAP therapy at 14 cm via large full facemask.   Today,  03/01/2015: I reviewed his CPAP compliance data from 01/23/2015 through 02/21/2015 which is a total of 30 days during which time he used his machine 28 days with percent used days greater than 4 hours at 93%, indicating excellent compliance with an average usage of 6 hours and 36 minutes, residual AHI low at 0.9 per hour, leak high with the 95th percentile at 64.9 L/m at a pressure of 14 cm.  I reviewed his CPAP compliance data from 01/10/2015 through 02/08/2015 which is a total of 30 days during which time he used his machine only 20 days with percent used days greater than 4 hours at 67%, indicating suboptimal compliance with an average usage of 4 hours and 37 minutes, residual AHI low, 1 per hour, leak high with the 95th percentile at 66.4 L/m on a pressure of 14 cm. Initial compliance from 01/02/2015 through 02/08/2015 showed 71% compliance. Leak was quite high at the time as well, residual AHI 1 per hour.  Today, 02/23/2015: He reports doing better. He feels well rested with CPAP. He has some issues with mask dislodging but this is not uncommon with using a fullface mask. He has some early morning sweats. He has not had a full physical yet. He is advised to pursue this. He is also advised to try to reduce the humidifier setting to see if his early morning sweating episodes improve. In the interim, he has lost weight, in the realm of 25 lb. On 02/22/15, he had laser ablation  right great saphenous vein performed under local tumescent anesthesia for a lateral venous stasis ulcers, under Dr. Kellie Simmering. Things are coming along. He got a new CPAP, some leak from the [facility].   Previously:   10/28/2014: He was previously diagnosed with obstructive sleep apnea and placed on CPAP therapy. His prior sleep test results are not available for my review today. He has not had a mask replacement in years. He has not had reevaluation in years. He reports daytime sleepiness He has been compliant with treatment, but has skipped  at times, noticing poor sleep quality, apneas, and gasping sensation. He has gained weight since his original sleep study some 10 years, in the realm of 40 lb. He has no significant RLS symptoms, but has numbness of his right thigh. He sleeps on his right side mostly. He did not bring his CPAP machine. It is about 12-73 years old. He is not sure what pressure he is on. He reports daytime somnolence. His Epworth sleepiness score is 14 out of 24, his fatigue scores 36 out of 63. He goes to bed around 11 and rise time is around 6. He does not always wake up rested. He works as a Curator. He has become drowsy while driving. He lives with his wife. She works at night. He has 2 children both living at home ages 64 and 51. He drinks caffeine in the form of cola, 2 per day, alcohol occasionally, he is a nonsmoker. He was supposed to see a vascular specialist but has not heard back for an appointment. He has nocturia about once per night.   His Past Medical History Is Significant For: Past Medical History  Diagnosis Date  . Sleep apnea   . Ulcer of leg, chronic (HCC)     His Past Surgical History Is Significant For: Past Surgical History  Procedure Laterality Date  . Hernia repair      His Family History Is Significant For: Family History  Problem Relation Age of Onset  . Stroke Mother     His Social History Is Significant For: Social History   Social History  . Marital Status: Married    Spouse Name: N/A  . Number of Children: 2  . Years of Education: N/A   Occupational History  . [employer]     Social History Main Topics  . Smoking status: Never Smoker   . Smokeless tobacco: Never Used  . Alcohol Use: No  . Drug Use: No  . Sexual Activity: Not Asked   Other Topics Concern  . None   Social History Narrative   Drinks about 1-2 cups of soda a day, occasionally drinks coffee     His Allergies Are:  No Known Allergies:   His Current Medications Are:  Outpatient Encounter  Prescriptions as of 03/01/2015  Medication Sig  . acetaminophen (TYLENOL) 500 MG tablet Take 500 mg by mouth every 6 (six) hours as needed.  . cephALEXin (KEFLEX) 500 MG capsule Take 1 capsule (500 mg total) by mouth 3 (three) times daily.  . furosemide (LASIX) 40 MG tablet Take 1 tablet (40 mg total) by mouth daily.  Marland Kitchen gabapentin (NEURONTIN) 300 MG capsule Start with one tablet at night. If tolerated you can take 1 tablet in the morning and one tablet at night.  Marland Kitchen ibuprofen (ADVIL,MOTRIN) 200 MG tablet Take 200 mg by mouth every 6 (six) hours as needed.  . terbinafine (LAMISIL) 250 MG tablet Take 1 tablet (250 mg total) by mouth daily.  Marland Kitchen  valACYclovir (VALTREX) 1000 MG tablet Take 1 tablet (1,000 mg total) by mouth 3 (three) times daily.   No facility-administered encounter medications on file as of 03/01/2015.  :  Review of Systems:  Out of a complete 14 point review of systems, all are reviewed and negative with the exception of these symptoms as listed below:   Review of Systems  Neurological:       Patient reports that his masks "moves" sometimes. He would like to talk about his diaphoretic episodes that occur about 3 am every night     Objective:  Neurologic Exam  Physical Exam Physical Examination:   Filed Vitals:   03/01/15 0948  BP: 127/72  Pulse: 76  Resp: 18   General Examination: The patient is a very pleasant 55 y.o. male in no acute distress. He is morbidly obese, but has lost some weight since our first visit. He is in good spirits today.  HEENT: Normocephalic, atraumatic, pupils are equal, round and reactive to light and accommodation. Extraocular tracking is good without limitation to gaze excursion or nystagmus noted. Normal smooth pursuit is noted. Hearing is grossly intact. Face is symmetric with normal facial animation and normal facial sensation. Speech is clear with no dysarthria noted. There is no hypophonia. There is no lip, neck/head, jaw or voice tremor. Neck  is supple with full range of passive and active motion. There are no carotid bruits on auscultation. Oropharynx exam reveals: mild mouth dryness, good dental hygiene and moderate airway crowding, due to larger uvula, thicker tongue, narrow airway entry, tonsils. Mallampati is class II. Tongue protrudes centrally and palate elevates symmetrically. Neck size is 19.5 in.  Chest: Clear to auscultation without wheezing, rhonchi or crackles noted.  Heart: S1+S2+0, regular and normal without murmurs, rubs or gallops noted.   Abdomen: Soft, non-tender and non-distended with normal bowel sounds appreciated on auscultation.  Extremities: There is 2-3+ pitting edema in the distal lower extremities bilaterally without any oozing noted and a healing ulcer in the medial aspect of the L shin.   Skin: Warm and dry without trophic changes noted in the UEs, but chronic discoloration noted in both LEs up to mid shin.   Musculoskeletal: exam reveals no obvious joint deformities, tenderness or joint swelling or erythema.   Neurologically:  Mental status: The patient is awake, alert and oriented in all 4 spheres. His immediate and remote memory, attention, language skills and fund of knowledge are appropriate. There is no evidence of aphasia, agnosia, apraxia or anomia. Speech is clear with normal prosody and enunciation. Thought process is linear. Mood is normal and affect is normal.  Cranial nerves II - XII are as described above under HEENT exam. In addition: shoulder shrug is normal with equal shoulder height noted. Motor exam: Normal bulk, strength and tone is noted. There is no drift, tremor or rebound. Romberg is negative. Reflexes are 1-2+ throughout. Fine motor skills and coordination: intact with normal finger taps, normal hand movements, normal rapid alternating patting, normal foot taps and normal foot agility.  Cerebellar testing: No dysmetria or intention tremor on finger to nose testing. There is no  truncal or gait ataxia.  Sensory exam: intact in the upper and lower extremities.  Gait, station and balance: He stands with mild difficulty. No veering to one side is noted. No leaning to one side is noted. Posture is age-appropriate and stance is narrow based. Gait shows slow gait, due to body habitus, tandem walk difficult, slightly better actually.  Assessment and Plan:   In summary, ARTIE MCINTYRE is a very pleasant 55 year old male with an underlying medical history of chronic venous stasis complicated by recurrent dermatitis of both lower extremities, and morbid obesity, who was previously diagnosed with obstructive sleep apnea. He had a recent split-night sleep study which confirmed severe obstructive sleep apnea. He did well with CPAP at 14 cm. He is compliant with treatment. He's working on weight loss and has has had indeed achieved almost 25 pounds of weight loss since I first met him. He has had improvement in his leg swelling and recently had laser surgery to his right saphenous vein. He now has a new CPAP machine and is completely compliant with treatment. He is commended for this. I would like to proceed with an overnight pulse oximetry test while he uses CPAP at 14 cm to ensure proper oxygen saturations overnight. He had some residual oxygen drops into the 80s when he was on CPAP therapy. He has had evidence of nocturnal hypoxemia, most likely secondary to severe obstructive sleep apnea and morbid obesity. Nevertheless, would like to make sure his oxygen saturations are appropriate while on CPAP therapy only.  I again explained to him the risks and ramifications of untreated moderate to severe OSA, especially with respect to developing cardiovascular disease down the Road, including congestive heart failure, difficult to treat hypertension, cardiac arrhythmias, or stroke. Even type 2 diabetes has, in part, been linked to untreated OSA. Symptoms of untreated OSA include daytime  sleepiness, memory problems, mood irritability and mood disorder such as depression and anxiety, lack of energy, as well as recurrent headaches, especially morning headaches. We talked about trying to maintain a healthy lifestyle in general, as well as the importance of weight control. I encouraged the patient to eat healthy, exercise daily and keep well hydrated, to keep a scheduled bedtime and wake time routine, to not skip any meals and eat healthy snacks in between meals. I advised the patient not to drive when feeling sleepy.  I also explained the importance of being compliant with PAP treatment, not only for insurance purposes but primarily to improve His symptoms, and for the patient's long term health benefit, including to reduce His cardiovascular risks.  I would like to see him back in 6 months, sooner if needed, especially if needed after his overnight pulse oximetry test results are in. We will call him with his test results as well.  I answered all his questions today and the patient was in agreement.   I spent 25 minutes in total face-to-face time with the patient, more than 50% of which was spent in counseling and coordination of care, reviewing test results, reviewing medication and discussing or reviewing the diagnosis of OSA, its prognosis and treatment options.

## 2015-03-03 ENCOUNTER — Encounter: Payer: Self-pay | Admitting: Neurology

## 2015-03-09 ENCOUNTER — Encounter (HOSPITAL_BASED_OUTPATIENT_CLINIC_OR_DEPARTMENT_OTHER): Payer: BLUE CROSS/BLUE SHIELD | Attending: Surgery

## 2015-03-16 ENCOUNTER — Encounter (HOSPITAL_BASED_OUTPATIENT_CLINIC_OR_DEPARTMENT_OTHER): Payer: BLUE CROSS/BLUE SHIELD

## 2015-08-30 ENCOUNTER — Telehealth: Payer: Self-pay

## 2015-08-30 ENCOUNTER — Ambulatory Visit: Payer: BLUE CROSS/BLUE SHIELD | Admitting: Neurology

## 2015-08-30 NOTE — Telephone Encounter (Signed)
Patient did not show to appt today  

## 2015-08-31 ENCOUNTER — Encounter: Payer: Self-pay | Admitting: Neurology

## 2017-04-25 ENCOUNTER — Encounter: Payer: Self-pay | Admitting: Physician Assistant

## 2017-05-11 DIAGNOSIS — G4733 Obstructive sleep apnea (adult) (pediatric): Secondary | ICD-10-CM | POA: Diagnosis not present

## 2017-06-11 DIAGNOSIS — G4733 Obstructive sleep apnea (adult) (pediatric): Secondary | ICD-10-CM | POA: Diagnosis not present

## 2017-07-11 DIAGNOSIS — G4733 Obstructive sleep apnea (adult) (pediatric): Secondary | ICD-10-CM | POA: Diagnosis not present

## 2017-08-10 DIAGNOSIS — G4733 Obstructive sleep apnea (adult) (pediatric): Secondary | ICD-10-CM | POA: Diagnosis not present

## 2017-09-10 DIAGNOSIS — G4733 Obstructive sleep apnea (adult) (pediatric): Secondary | ICD-10-CM | POA: Diagnosis not present

## 2017-10-10 DIAGNOSIS — G4733 Obstructive sleep apnea (adult) (pediatric): Secondary | ICD-10-CM | POA: Diagnosis not present

## 2017-11-09 DIAGNOSIS — G4733 Obstructive sleep apnea (adult) (pediatric): Secondary | ICD-10-CM | POA: Diagnosis not present

## 2017-12-10 DIAGNOSIS — G4733 Obstructive sleep apnea (adult) (pediatric): Secondary | ICD-10-CM | POA: Diagnosis not present

## 2018-01-09 DIAGNOSIS — G4733 Obstructive sleep apnea (adult) (pediatric): Secondary | ICD-10-CM | POA: Diagnosis not present

## 2019-03-21 DIAGNOSIS — I872 Venous insufficiency (chronic) (peripheral): Secondary | ICD-10-CM | POA: Diagnosis not present

## 2019-03-21 DIAGNOSIS — L97229 Non-pressure chronic ulcer of left calf with unspecified severity: Secondary | ICD-10-CM | POA: Diagnosis not present

## 2019-03-21 DIAGNOSIS — R0602 Shortness of breath: Secondary | ICD-10-CM | POA: Diagnosis not present

## 2019-03-21 DIAGNOSIS — E119 Type 2 diabetes mellitus without complications: Secondary | ICD-10-CM | POA: Diagnosis not present

## 2019-03-25 ENCOUNTER — Encounter (HOSPITAL_BASED_OUTPATIENT_CLINIC_OR_DEPARTMENT_OTHER): Payer: Self-pay | Attending: Internal Medicine | Admitting: Internal Medicine

## 2019-03-26 DIAGNOSIS — L97229 Non-pressure chronic ulcer of left calf with unspecified severity: Secondary | ICD-10-CM | POA: Diagnosis not present

## 2019-04-01 DIAGNOSIS — I872 Venous insufficiency (chronic) (peripheral): Secondary | ICD-10-CM | POA: Diagnosis not present

## 2019-04-01 DIAGNOSIS — Z6841 Body Mass Index (BMI) 40.0 and over, adult: Secondary | ICD-10-CM | POA: Diagnosis not present

## 2019-04-01 DIAGNOSIS — E1165 Type 2 diabetes mellitus with hyperglycemia: Secondary | ICD-10-CM | POA: Diagnosis not present

## 2019-04-15 DIAGNOSIS — E119 Type 2 diabetes mellitus without complications: Secondary | ICD-10-CM | POA: Diagnosis not present

## 2019-04-15 DIAGNOSIS — I872 Venous insufficiency (chronic) (peripheral): Secondary | ICD-10-CM | POA: Diagnosis not present

## 2019-06-16 ENCOUNTER — Ambulatory Visit
Admission: RE | Admit: 2019-06-16 | Discharge: 2019-06-16 | Disposition: A | Discharge status: Home / Self Care | Payer: BC Managed Care – PPO | Source: Ambulatory Visit | Attending: Physician Assistant | Admitting: Physician Assistant

## 2019-06-16 ENCOUNTER — Other Ambulatory Visit: Payer: Self-pay | Admitting: Physician Assistant

## 2019-06-16 DIAGNOSIS — E11628 Type 2 diabetes mellitus with other skin complications: Secondary | ICD-10-CM | POA: Diagnosis not present

## 2019-06-16 DIAGNOSIS — M25561 Pain in right knee: Secondary | ICD-10-CM

## 2019-06-16 DIAGNOSIS — Z6841 Body Mass Index (BMI) 40.0 and over, adult: Secondary | ICD-10-CM | POA: Diagnosis not present

## 2019-06-16 DIAGNOSIS — I872 Venous insufficiency (chronic) (peripheral): Secondary | ICD-10-CM | POA: Diagnosis not present

## 2019-07-04 ENCOUNTER — Ambulatory Visit: Payer: BC Managed Care – PPO | Admitting: Podiatry

## 2019-07-04 ENCOUNTER — Other Ambulatory Visit: Payer: Self-pay

## 2019-07-04 DIAGNOSIS — B351 Tinea unguium: Secondary | ICD-10-CM | POA: Diagnosis not present

## 2019-07-04 DIAGNOSIS — M79674 Pain in right toe(s): Secondary | ICD-10-CM | POA: Diagnosis not present

## 2019-07-04 DIAGNOSIS — M79675 Pain in left toe(s): Secondary | ICD-10-CM

## 2019-07-06 ENCOUNTER — Encounter: Payer: Self-pay | Admitting: Podiatry

## 2019-07-06 NOTE — Progress Notes (Signed)
  Subjective:  Patient ID: Bruce Pace, male    DOB: August 31, 1960,  MRN: [id number]  Chief Complaint  Patient presents with  . Nail Problem    Pt states interest in long term antifungal treatment for his nails. Today he would also like to do a nail trim bilateral 1-5.   59 y.o. male returns for the above complaint.  Patient presents with thickened elongated dystrophic toenails x10.  Patient states that he has tried debride them down but is not able to do so.  He would like for me to debride them down.  They are painful to touch.  They are painful when he is ambulating on them.  He denies any other acute complaints.  Patient is borderline diabetic with last A1c of 5.8.  Objective:  There were no vitals filed for this visit. Podiatric Exam: Vascular: dorsalis pedis and posterior tibial pulses are palpable bilateral. Capillary return is immediate. Temperature gradient is WNL. Skin turgor WNL  Sensorium: Normal Semmes Weinstein monofilament test. Normal tactile sensation bilaterally. Nail Exam: Pt has thick disfigured discolored nails with subungual debris noted bilateral entire nail hallux through fifth toenails.  Pain on palpation to the nails. Ulcer Exam: There is no evidence of ulcer or pre-ulcerative changes or infection. Orthopedic Exam: Muscle tone and strength are WNL. No limitations in general ROM. No crepitus or effusions noted. HAV  B/L.  Hammer toes 2-5  B/L. Skin: No Porokeratosis. No infection or ulcers    Assessment & Plan:  No diagnosis found.  Patient was evaluated and treated and all questions answered.  Onychomycosis with pain  -Nails palliatively debrided as below. -Educated on self-care  Procedure: Nail Debridement Rationale: pain  Type of Debridement: manual, sharp debridement. Instrumentation: Nail nipper, rotary burr. Number of Nails: 10  Procedures and Treatment: Consent by patient was obtained for treatment procedures. The patient understood the discussion  of treatment and procedures well. All questions were answered thoroughly reviewed. Debridement of mycotic and hypertrophic toenails, 1 through 5 bilateral and clearing of subungual debris. No ulceration, no infection noted.  Return Visit-Office Procedure: Patient instructed to return to the office for a follow up visit 3 months for continued evaluation and treatment.  Nicholes Rough, DPM    No follow-ups on file.

## 2019-09-16 DIAGNOSIS — E11628 Type 2 diabetes mellitus with other skin complications: Secondary | ICD-10-CM | POA: Diagnosis not present

## 2019-09-16 DIAGNOSIS — M1711 Unilateral primary osteoarthritis, right knee: Secondary | ICD-10-CM | POA: Diagnosis not present

## 2019-09-16 DIAGNOSIS — I872 Venous insufficiency (chronic) (peripheral): Secondary | ICD-10-CM | POA: Diagnosis not present

## 2019-09-22 DIAGNOSIS — M25561 Pain in right knee: Secondary | ICD-10-CM | POA: Diagnosis not present

## 2019-10-10 ENCOUNTER — Ambulatory Visit: Payer: BC Managed Care – PPO | Admitting: Podiatry

## 2019-10-21 ENCOUNTER — Other Ambulatory Visit: Payer: Self-pay

## 2019-10-21 ENCOUNTER — Ambulatory Visit (INDEPENDENT_AMBULATORY_CARE_PROVIDER_SITE_OTHER): Payer: BC Managed Care – PPO | Admitting: Family Medicine

## 2019-10-21 ENCOUNTER — Encounter (INDEPENDENT_AMBULATORY_CARE_PROVIDER_SITE_OTHER): Payer: Self-pay | Admitting: Family Medicine

## 2019-10-21 VITALS — BP 98/61 | HR 80 | Temp 98.1°F | Ht 66.0 in | Wt 379.4 lb

## 2019-10-21 DIAGNOSIS — Z1331 Encounter for screening for depression: Secondary | ICD-10-CM | POA: Diagnosis not present

## 2019-10-21 DIAGNOSIS — R0602 Shortness of breath: Secondary | ICD-10-CM

## 2019-10-21 DIAGNOSIS — I872 Venous insufficiency (chronic) (peripheral): Secondary | ICD-10-CM

## 2019-10-21 DIAGNOSIS — R5383 Other fatigue: Secondary | ICD-10-CM | POA: Diagnosis not present

## 2019-10-21 DIAGNOSIS — Z6841 Body Mass Index (BMI) 40.0 and over, adult: Secondary | ICD-10-CM | POA: Diagnosis not present

## 2019-10-21 DIAGNOSIS — E1165 Type 2 diabetes mellitus with hyperglycemia: Secondary | ICD-10-CM | POA: Insufficient documentation

## 2019-10-21 DIAGNOSIS — G4733 Obstructive sleep apnea (adult) (pediatric): Secondary | ICD-10-CM

## 2019-10-21 DIAGNOSIS — R457 State of emotional shock and stress, unspecified: Secondary | ICD-10-CM

## 2019-10-21 DIAGNOSIS — Z0289 Encounter for other administrative examinations: Secondary | ICD-10-CM

## 2019-10-23 NOTE — Progress Notes (Signed)
Dear Dr. Eulah Pont,   Thank you for referring Bruce Pace to our clinic. The following note includes my evaluation and treatment recommendations.  Chief Complaint:   OBESITY Bruce Pace (MR# [id number]) is a 59 y.o. male who presents for evaluation and treatment of obesity and related comorbidities. Current BMI is Body mass index is 61.24 kg/m. Bruce Pace has been struggling with his weight for many years and has been unsuccessful in either losing weight, maintaining weight loss, or reaching his healthy weight goal.  Bruce Pace is currently in the action stage of change and ready to dedicate time achieving and maintaining a healthier weight. Bruce Pace is interested in becoming our patient and working on intensive lifestyle modifications including (but not limited to) diet and exercise for weight loss.  Bruce Pace was referred her by his orthopedist, Dr. Renaye Rakers.  He lives with his wife, Bruce Pace, who is 46 years old, and his 72 year old daughter.  Bruce Pace's habits were reviewed today and are as follows: His family eats meals together, he thinks his family will eat healthier with him, his desired weight loss is 70 pounds, he has been heavy most of his life, he started gaining weight since age 50, his heaviest weight ever was 407 pounds, he craves sweets, sodas, fruit, carbohydrates, fatty foods, meat, and bacon, he snacks frequently in the evenings, he wakes up frequently in the middle of the night to eat, he frequently makes poor food choices, he frequently eats larger portions than normal and he struggles with emotional eating.  Depression Screen Kreig's Food and Mood (modified PHQ-9) score was 17.  Depression screen [facility] 2/9 10/21/2019  Decreased Interest 3  Down, Depressed, Hopeless 1  PHQ - 2 Score 4  Altered sleeping 2  Tired, decreased energy 3  Change in appetite 2  Feeling bad or failure about yourself  1  Trouble concentrating 3  Moving slowly or fidgety/restless 0  Suicidal thoughts 0   PHQ-9 Score 15   Subjective:   1. Other fatigue Elvyn admits to daytime somnolence and reports waking up still tired. Patent has a history of symptoms of daytime fatigue, morning fatigue and snoring. [name] generally gets 6 hours of sleep per night, and states that he has poor quality sleep. Snoring is present. Apneic episodes are present. Epworth Sleepiness Score is 14.  2. Shortness of breath on exertion Bruce Palms notes increasing shortness of breath with exercising and seems to be worsening over time with weight gain. He notes getting out of breath sooner with activity than he used to. This has gotten worse recently. Marcellius denies shortness of breath at rest or orthopnea.  3. Uncontrolled type 2 diabetes mellitus with hyperglycemia, without long-term current use of insulin (HCC) Medications reviewed. Diabetic ROS: no polyuria or polydipsia, no chest pain, dyspnea or TIA's, no numbness, tingling or pain in extremities.  He is on Trulicity and metformin.  He last went to his PCP 2-3 months ago.  A1c was "over 8 then" and PCP increased his Trulicity dose, per his recollection.  Lab Results  Component Value Date   HGBA1C 5.8 09/29/2014   HGBA1C 5.7 02/06/2013   Lab Results  Component Value Date   CREATININE 0.65 (L) 09/29/2014   4. Venous insufficiency of both lower extremities Chronic.  No acute changes.  5. 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  No acute changes.  Positive history of ulcers due to venous stasis in recent past as well.  6. OSA (obstructive sleep apnea)  Bruce Pace has a diagnosis of sleep apnea. He reports that he is not using a CPAP regularly.  Diagnosed 6 years ago.  He has not used his CPAP machine for 2 years.  Says he just stopped using it.  Positive Epworth sleepiness scale.  7. Emotional stress, with emotional eating Positive PHQ-9 at 17.  He states he does not feel depressed at all.  He says, "I'm a pretty happy guy, but life can be  stressful at times".  Denies need for medication, etc.  8. Depression screening Bruce Pace was screened for depression as part of his new patient workup.  PHQ-9 is 17.  This is the patient's first visit at [facility].  The patient's NEW PATIENT PACKET that they filled out prior to today's office visit was reviewed at length and some information from that paperwork was also included within the following office visit note.    Included in the packet: current and past health history, medications, allergies, ROS, gynecologic history (women only), surgical history, family history, social history, weight history, weight loss surgery history (for those that have had weight loss surgery), nutritional evaluation, mood and food questionnaire along with a depression screening (PHQ9) on all patients, an Epworth questionnaire, sleep habits questionnaire, patient life and health improvement goals questionnaire. These will all be scanned into the patient's chart under media.   During the visit, I independently reviewed the patient's EKG, bioimpedance scale results, and indirect calorimeter results. I used this information to tailor a meal plan for the patient that will help Bruce Pace to lose weight and will improve his obesity-related conditions going forward. I performed a medically necessary appropriate examination and/or evaluation. I discussed the assessment and treatment plan with the patient. The patient was provided an opportunity to ask questions and all were answered. The patient agreed with the plan and demonstrated an understanding of the instructions. Labs were ordered at this visit and will be reviewed at the next visit unless more critical results need to be addressed immediately. Clinical information was updated and documented in the EMR.   Time spent on visit including pre-visit chart review and post-visit care was estimated to be 60-74 minutes.  A separate 15 minutes was spent on risk counseling  (see above/below).   Assessment/Plan:   1. Other fatigue Bruce Pace does feel that his weight is causing his energy to be lower than it should be. Fatigue may be related to obesity, depression or many other causes. Labs will be ordered, and in the meanwhile, Bruce Pace will focus on self care including making healthy food choices, increasing physical activity and focusing on stress reduction.  - Electrocardiogram report - T3 - T4 - TSH - Vitamin B12 - VITAMIN D 25 Hydroxy (Vit-D Deficiency, Fractures) - Folate - Insulin, random - Hemoglobin A1c - COMPLETE METABOLIC PANEL WITH GFR - CBC with Differential/Platelet  2. Shortness of breath on exertion Bruce Pace does feel that he gets out of breath more easily that he used to when he exercises. Graylon's shortness of breath appears to be obesity related and exercise induced. He has agreed to work on weight loss and gradually increase exercise to treat his exercise induced shortness of breath. Will continue to monitor closely.  - Lipid Panel With LDL/HDL Ratio  3. Uncontrolled type 2 diabetes mellitus with hyperglycemia, without long-term current use of insulin (HCC) Good blood sugar control is important to decrease the likelihood of diabetic complications such as nephropathy, neuropathy, limb loss, blindness, coronary artery disease, and death. Intensive lifestyle  modification including diet, exercise and weight loss are the first line of treatment for diabetes.  Check labs, prudent nutritional plan, weight loss, decrease simple carbs.  - Insulin, random - Hemoglobin A1c - COMPLETE METABOLIC PANEL WITH GFR - CBC with Differential/Platelet  4. Venous insufficiency of both lower extremities Weight loss via prudent nutritional plan.  Check labs.  5. Chronic venous stasis dermatitis of both lower extremities Eventually increase activity, low salt meal plan, weight loss, check labs.  6. OSA (obstructive sleep apnea) Intensive lifestyle modifications are  the first line treatment for this issue. We discussed several lifestyle modifications today and he will continue to work on diet, exercise and weight loss efforts. We will continue to monitor. Orders and follow up as documented in patient record.  Importance of adherence with treatment plan discussed with him.  Re-evaluation may be needed.  He will follow-up with his PCP about this in the near future in case he needs re-evaluation/adjustment of CPAP settings, etc.  7. Emotional stress, with emotional eating Prudent nutritional plan, will closely monitor.  Strategies discussed with him.  He declines referral to Dr. Dewaine Conger.  Discussed her role with him.  8. Depression screening Octavis had a positive depression screening. Depression is commonly associated with obesity and often results in emotional eating behaviors. We will monitor this closely and work on CBT to help improve the non-hunger eating patterns. Referral to Psychology may be required if no improvement is seen as he continues in our clinic.  9. Class 3 severe obesity with serious comorbidity and body mass index (BMI) of 60.0 to 69.9 in adult, unspecified obesity type [facility])  Rudell is currently in the action stage of change and his goal is to continue with weight loss efforts. I recommend Captain begin the structured treatment plan as follows:  He has agreed to the Category 3 Plan.  Exercise goals: As is.   Behavioral modification strategies: increasing lean protein intake, decreasing simple carbohydrates, increasing water intake, no skipping meals, meal planning and cooking strategies and planning for success.  He was informed of the importance of frequent follow-up visits to maximize his success with intensive lifestyle modifications for his multiple health conditions. He was informed we would discuss his lab results at his next visit unless there is a critical issue that needs to be addressed sooner. Philip agreed to keep his next visit at the  agreed upon time to discuss these results.  Objective:   Blood pressure 98/61, pulse 80, temperature 98.1 F (36.7 C), height 5\' 6"  (1.676 m), weight (!) 379 lb 6.4 oz (172.1 kg), SpO2 95 %. Body mass index is 61.24 kg/m.  Indirect Calorimeter completed today shows a VO2 of 302 and a REE of 2104.  His calculated basal metabolic rate is 2105 thus his basal metabolic rate is worse than expected.  General: Cooperative, alert, well developed, in no acute distress. HEENT: Conjunctivae and lids unremarkable. Cardiovascular: Regular rhythm.  Lungs: Normal work of breathing. Neurologic: No focal deficits.   Lab Results  Component Value Date   CREATININE 0.65 (L) 09/29/2014   BUN 12 09/29/2014   NA 139 09/29/2014   K 4.5 09/29/2014   CL 102 09/29/2014   CO2 29 09/29/2014   Lab Results  Component Value Date   ALT 13 09/29/2014   AST 12 09/29/2014   ALKPHOS 76 09/29/2014   BILITOT 0.3 09/29/2014   Lab Results  Component Value Date   HGBA1C 5.8 09/29/2014   HGBA1C 5.7 02/06/2013  Lab Results  Component Value Date   TSH 2.266 02/06/2013   Lab Results  Component Value Date   WBC 10.1 02/14/2013   HGB 14.1 02/14/2013   HCT 44.9 02/14/2013   MCV 88.3 02/14/2013   Obesity Behavioral Intervention:   Approximately 15 minutes were spent on the discussion below.  ASK: We discussed the diagnosis of obesity with Bruce Palms today and Beauregard agreed to give [place] permission to discuss obesity behavioral modification therapy today.  ASSESS: Dajon has the diagnosis of obesity and his BMI today is 61.2. Pranav is in the action stage of change.   ADVISE: Mylon was educated on the multiple health risks of obesity as well as the benefit of weight loss to improve his health. He was advised of the need for long term treatment and the importance of lifestyle modifications to improve his current health and to decrease his risk of future health problems.  AGREE: Multiple dietary modification options  and treatment options were discussed and Rigel agreed to follow the recommendations documented in the above note.  ARRANGE: Aldair was educated on the importance of frequent visits to treat obesity as outlined per CMS and USPSTF guidelines and agreed to schedule his next follow up appointment today.  Attestation Statements:   Reviewed by clinician on day of visit: allergies, medications, problem list, medical history, surgical history, family history, social history, and previous encounter notes.  I, Insurance claims handler, CMA, am acting as Energy manager for [employer], DO.  I have reviewed the above documentation for accuracy and completeness, and I agree with the above. Thomasene Lot, DO

## 2019-11-04 ENCOUNTER — Other Ambulatory Visit: Payer: Self-pay

## 2019-11-04 ENCOUNTER — Ambulatory Visit (INDEPENDENT_AMBULATORY_CARE_PROVIDER_SITE_OTHER): Payer: Medicare PPO | Admitting: Family Medicine

## 2019-11-04 ENCOUNTER — Encounter (INDEPENDENT_AMBULATORY_CARE_PROVIDER_SITE_OTHER): Payer: Self-pay | Admitting: Family Medicine

## 2019-11-04 VITALS — BP 107/66 | HR 65 | Temp 98.0°F | Ht 66.0 in | Wt 380.0 lb

## 2019-11-04 DIAGNOSIS — R0602 Shortness of breath: Secondary | ICD-10-CM | POA: Diagnosis not present

## 2019-11-04 DIAGNOSIS — R5383 Other fatigue: Secondary | ICD-10-CM | POA: Diagnosis not present

## 2019-11-04 DIAGNOSIS — G4733 Obstructive sleep apnea (adult) (pediatric): Secondary | ICD-10-CM

## 2019-11-04 DIAGNOSIS — Z6841 Body Mass Index (BMI) 40.0 and over, adult: Secondary | ICD-10-CM

## 2019-11-04 DIAGNOSIS — Z9119 Patient's noncompliance with other medical treatment and regimen: Secondary | ICD-10-CM

## 2019-11-04 DIAGNOSIS — E119 Type 2 diabetes mellitus without complications: Secondary | ICD-10-CM | POA: Insufficient documentation

## 2019-11-04 DIAGNOSIS — Z91199 Patient's noncompliance with other medical treatment and regimen due to unspecified reason: Secondary | ICD-10-CM | POA: Insufficient documentation

## 2019-11-04 DIAGNOSIS — E1169 Type 2 diabetes mellitus with other specified complication: Secondary | ICD-10-CM

## 2019-11-04 DIAGNOSIS — Z9989 Dependence on other enabling machines and devices: Secondary | ICD-10-CM

## 2019-11-04 DIAGNOSIS — R457 State of emotional shock and stress, unspecified: Secondary | ICD-10-CM | POA: Diagnosis not present

## 2019-11-04 DIAGNOSIS — E1165 Type 2 diabetes mellitus with hyperglycemia: Secondary | ICD-10-CM | POA: Diagnosis not present

## 2019-11-05 ENCOUNTER — Ambulatory Visit (INDEPENDENT_AMBULATORY_CARE_PROVIDER_SITE_OTHER): Payer: BC Managed Care – PPO | Admitting: Family Medicine

## 2019-11-05 LAB — LIPID PANEL WITH LDL/HDL RATIO
Cholesterol, Total: 164 mg/dL (ref 100–199)
HDL: 26 mg/dL — ABNORMAL LOW (ref >39)
LDL Chol Calc (NIH): 102 mg/dL — ABNORMAL HIGH (ref 0–99)
LDL/HDL Ratio: 3.9 ratio — ABNORMAL HIGH (ref 0.0–3.6)
Triglycerides: 207 mg/dL — ABNORMAL HIGH (ref 0–149)
VLDL Cholesterol Cal: 36 mg/dL (ref 5–40)

## 2019-11-05 LAB — VITAMIN D 25 HYDROXY (VIT D DEFICIENCY, FRACTURES): Vit D, 25-Hydroxy: 8 ng/mL — ABNORMAL LOW (ref 30.0–100.0)

## 2019-11-05 LAB — CBC WITH DIFFERENTIAL/PLATELET
Basophils Absolute: 0 10*3/uL (ref 0.0–0.2)
Basos: 1 %
EOS (ABSOLUTE): 0.2 10*3/uL (ref 0.0–0.4)
Eos: 3 %
Hematocrit: 44.1 % (ref 37.5–51.0)
Hemoglobin: 14.1 g/dL (ref 13.0–17.7)
Immature Grans (Abs): 0 10*3/uL (ref 0.0–0.1)
Immature Granulocytes: 0 %
Lymphocytes Absolute: 1.6 10*3/uL (ref 0.7–3.1)
Lymphs: 19 %
MCH: 26.6 pg (ref 26.6–33.0)
MCHC: 32 g/dL (ref 31.5–35.7)
MCV: 83 fL (ref 79–97)
Monocytes Absolute: 0.4 10*3/uL (ref 0.1–0.9)
Monocytes: 5 %
Neutrophils Absolute: 6.2 10*3/uL (ref 1.4–7.0)
Neutrophils: 72 %
Platelets: 263 10*3/uL (ref 150–450)
RBC: 5.3 x10E6/uL (ref 4.14–5.80)
RDW: 15.7 % — ABNORMAL HIGH (ref 11.6–15.4)
WBC: 8.5 10*3/uL (ref 3.4–10.8)

## 2019-11-05 LAB — FOLATE: Folate: 6.1 ng/mL (ref >3.0)

## 2019-11-05 LAB — INSULIN, RANDOM: INSULIN: 13.2 u[IU]/mL (ref 2.6–24.9)

## 2019-11-05 LAB — T3: T3, Total: 98 ng/dL (ref 71–180)

## 2019-11-05 LAB — HEMOGLOBIN A1C
Est. average glucose Bld gHb Est-mCnc: 146 mg/dL
Hgb A1c MFr Bld: 6.7 % — ABNORMAL HIGH (ref 4.8–5.6)

## 2019-11-05 LAB — T4: T4, Total: 5.9 ug/dL (ref 4.5–12.0)

## 2019-11-05 LAB — TSH: TSH: 3.11 u[IU]/mL (ref 0.450–4.500)

## 2019-11-05 LAB — VITAMIN B12: Vitamin B-12: 448 pg/mL (ref 232–1245)

## 2019-11-05 NOTE — Progress Notes (Signed)
Chief Complaint:   OBESITY Bruce Pace is here to discuss his progress with his obesity treatment plan along with follow-up of his obesity related diagnoses. Bruce Pace is on the Category 3 Plan and states he is following his eating plan approximately 20% of the time. Bruce Pace states he is walking for 5-6 minutes 5 times per week.  Today's visit was #: 2 Starting weight: 379 lbs Starting date: 10/21/2019 Today's weight: 380 lbs Today's date: 11/04/2019 Total lbs lost to date: +1 lb Total lbs lost since last in-office visit: +1 lb  Interim History: Bruce Pace really was not able to follow the plan.  He did not buy the foods needed.  He did not feel mentally ready to start.  He drank mostly water with some Crystal Light, but stayed away from sugary drinks.  He did very little snacking.  He weighed his meat one time and he was shocked that it took 3 breasts of chicken for him to hit his goals.  Assessment/Plan:   1. Type 2 diabetes mellitus with other specified complication, without long-term current use of insulin (HCC) He is not checking his blood sugars at home.  Denies symptoms or concerns.  Sees Bennye Alm for PCP, but cannot recall his last A1c or when it was last done.  Plan:  Told to check FBS and 2 hour postprandial, keep log and bring in at next office visit.  We will obtain full set of fasting blood work today.  Lab Results  Component Value Date   HGBA1C 6.7 (H) 11/04/2019   HGBA1C 5.8 09/29/2014   HGBA1C 5.7 02/06/2013   Lab Results  Component Value Date   LDLCALC 102 (H) 11/04/2019   CREATININE 0.65 (L) 09/29/2014   Lab Results  Component Value Date   INSULIN 13.2 11/04/2019   - Comprehensive metabolic panel - CBC with Differential/Platelet - Hemoglobin A1c - Insulin, random - Lipid panel - Vitamin B12 - Folate - T3 - T4 - TSH - VITAMIN D 25 Hydroxy (Vit-D Deficiency, Fractures)  2. Non-compliance, h/o I had a discussion with Bruce Pace how he has been lost to  follow-up with his Sleep Medicine doctor as well as others in the past.  Plan:  We discussed barriers and how to engage in better self-care practices.  He wants to be a better role model to his obese daughter and says he has the time to do better, cook, etc.  3. OSA on CPAP Bruce Pace has a diagnosis of sleep apnea. He reports that he is using a CPAP regularly.  He was seen by Dr. Frances Furbish of Neurology in the past.  He was last seen in 2017, and was asked to follow-up in 6 months and never did.  He uses his mask every night.  Plan:  Call Neurology for an appointment for your OSA evaluation.    4. Class 3 severe obesity with serious comorbidity and body mass index (BMI) of 60.0 to 69.9 in adult, unspecified obesity type Bruce Pace)  Bruce Pace is currently in the action stage of change. As such, his goal is to continue with weight loss efforts. He has agreed to the Category 3 Plan.   Exercise goals: As is.  Behavioral modification strategies: increasing lean protein intake, decreasing simple carbohydrates, decreasing eating out/eating out guide, meal planning and cooking strategies and planning for success.    Cooking recipes discussed with him and handouts given.    Bruce Pace has agreed to follow-up with our clinic in 2 weeks. He was informed of  the importance of frequent follow-up visits to maximize his success with intensive lifestyle modifications for his multiple health conditions.   Bruce Pace was informed we would discuss his lab results at his next visit unless there is a critical issue that needs to be addressed sooner. Bruce Pace agreed to keep his next visit at the agreed upon time to discuss these results.  Objective:   Blood pressure 107/66, pulse 65, temperature 98 F (36.7 C), height 5\' 6"  (1.676 m), weight (!) 380 lb (172.4 kg), SpO2 96 %. Body mass index is 61.33 kg/m.  General: Cooperative, alert, well developed, in no acute distress. HEENT: Conjunctivae and lids unremarkable. Cardiovascular: Regular  rhythm.  Lungs: Normal work of breathing. Neurologic: No focal deficits.   Lab Results  Component Value Date   CREATININE 0.65 (L) 09/29/2014   BUN 12 09/29/2014   NA 139 09/29/2014   K 4.5 09/29/2014   CL 102 09/29/2014   CO2 29 09/29/2014   Lab Results  Component Value Date   ALT 13 09/29/2014   AST 12 09/29/2014   ALKPHOS 76 09/29/2014   BILITOT 0.3 09/29/2014   Lab Results  Component Value Date   HGBA1C 6.7 (H) 11/04/2019   HGBA1C 5.8 09/29/2014   HGBA1C 5.7 02/06/2013   Lab Results  Component Value Date   INSULIN 13.2 11/04/2019   Lab Results  Component Value Date   TSH 3.110 11/04/2019   Lab Results  Component Value Date   CHOL 164 11/04/2019   HDL 26 (L) 11/04/2019   LDLCALC 102 (H) 11/04/2019   TRIG 207 (H) 11/04/2019   Lab Results  Component Value Date   WBC 8.5 11/04/2019   HGB 14.1 11/04/2019   HCT 44.1 11/04/2019   MCV 83 11/04/2019   PLT 263 11/04/2019   Attestation Statements:   Reviewed by clinician on day of visit: allergies, medications, problem list, medical history, surgical history, family history, social history, and previous encounter notes.  Time spent on visit including pre-visit chart review and post-visit care and charting was 31 minutes.   I, 01/04/2020, CMA, am acting as Insurance claims handler for Energy manager, DO.  I have reviewed the above documentation for accuracy and completeness, and I agree with the above. [employer], D.O.  The 21st Century Cures Act was signed into law in 2016 which includes the topic of electronic health records.  This provides immediate access to information in MyChart.  This includes consultation notes, operative notes, office notes, lab results and pathology reports.  If you have any questions about what you read please let 2017 know at your next visit so we can discuss your concerns and take corrective action if need be.  We are right here with you.

## 2019-11-24 ENCOUNTER — Ambulatory Visit (INDEPENDENT_AMBULATORY_CARE_PROVIDER_SITE_OTHER): Payer: Self-pay | Admitting: Family Medicine

## 2019-11-24 ENCOUNTER — Encounter (INDEPENDENT_AMBULATORY_CARE_PROVIDER_SITE_OTHER): Payer: Self-pay | Admitting: Family Medicine

## 2019-11-24 ENCOUNTER — Other Ambulatory Visit: Payer: Self-pay

## 2019-11-24 VITALS — BP 102/62 | HR 80 | Temp 99.1°F | Ht 66.0 in | Wt 375.0 lb

## 2019-11-24 DIAGNOSIS — Z6841 Body Mass Index (BMI) 40.0 and over, adult: Secondary | ICD-10-CM

## 2019-11-24 DIAGNOSIS — E559 Vitamin D deficiency, unspecified: Secondary | ICD-10-CM

## 2019-11-24 DIAGNOSIS — E1169 Type 2 diabetes mellitus with other specified complication: Secondary | ICD-10-CM | POA: Diagnosis not present

## 2019-11-24 DIAGNOSIS — E785 Hyperlipidemia, unspecified: Secondary | ICD-10-CM

## 2019-11-24 MED ORDER — VITAMIN D (ERGOCALCIFEROL) 1.25 MG (50000 UNIT) PO CAPS: 50000.0000 [IU] | ORAL_CAPSULE | ORAL | 0 refills | Status: DC

## 2019-11-25 MED ORDER — TRULICITY 1.5 MG/0.5ML ~~LOC~~ SOAJ: 1.5000 mg | SUBCUTANEOUS | 0 refills | Status: DC

## 2019-11-25 NOTE — Progress Notes (Signed)
Chief Complaint:   OBESITY Bruce Pace is here to discuss his progress with his obesity treatment plan along with follow-up of his obesity related diagnoses. Bruce Pace is on the Category 3 Plan and states he is following his eating plan approximately 85% of the time. Bruce Pace states he is walking for 10 minutes 5 times per week.  Today's visit was #: 3 Starting weight: 379 lbs Starting date: 10/21/2019 Today's weight: 375 lbs Today's date: 11/24/2019 Total lbs lost to date: 4 lbs Total lbs lost since last in-office visit: 5 lbs Total weight loss percentage to date: -1.06%   Interim History: Bruce Pace was able to eat on plan much more than prior.  He has multiple questions about exchanges/ alternative foods.   He is proud of himself.  No lows or highs with his blood sugar.  Denies concerns with hypoglycemic events.  Denies hunger or cravings    Assessment/Plan:   Meds ordered this encounter  Medications  . Vitamin D, Ergocalciferol, (DRISDOL) 1.25 MG (50000 UNIT) CAPS capsule    Sig: Take 1 capsule (50,000 Units total) by mouth every 7 (seven) days.    Dispense:  4 capsule    Refill:  0  . Dulaglutide (TRULICITY) 1.5 MG/0.5ML SOPN    Sig: Inject 1.5 mg into the skin once a week.    Dispense:  2 mL    Refill:  0    Lab Orders     Comprehensive metabolic panel    1. Type 2 diabetes mellitus with other specified complication, without long-term current use of insulin (HCC) Worsening.  Discussed labs with patient today.  A1c was 6.5 recently at his PCP's office @ [facility] [place] per pt.    No low blood sugars.   He checked at home and brought his log as instructed.   He says that 60 was his lowest, but no symptoms and he did not believe it.  All others were 110s-130s.  Feels well.   Plan:  Refill Trulicity.  Continue blood sugar log and home blood sugar monitoring.  Decrease simple carbs, increase proteins, follow meal plan & lose weight.   - Check CMP at next office visit as it was  never drawn for an unknown reason.  Lab Results  Component Value Date   HGBA1C 6.7 (H) 11/04/2019   HGBA1C 5.8 09/29/2014   HGBA1C 5.7 02/06/2013   Lab Results  Component Value Date   LDLCALC 102 (H) 11/04/2019   CREATININE 0.65 (L) 09/29/2014   Lab Results  Component Value Date   INSULIN 13.2 11/04/2019   - Comprehensive metabolic panel -Refill Dulaglutide (TRULICITY) 1.5 MG/0.5ML SOPN; Inject 1.5 mg into the skin once a week.  Dispense: 2 mL; Refill: 0    2. Hyperlipidemia associated with type 2 diabetes mellitus (HCC) Discussed labs with patient today.  High LDL, lod HDL and high TG.   He is on no medication.  Never was on any in the past.  Ate a lot of fatty meats and fried foods prior  Plan:  LDL not at goal of <70.   Extensive education done.   Discussed with him decreasing saturated fats, increasing exercise, etc., and he tells me he will talk to his PCP within the next 1-2 weeks at his follow-up visit regarding treatment plan.   - He prefers no medications and wants to lose weight, eat right, and recheck labs in 3 months or so.  I recommend he discuss with his PCP first and let us  know what they decide together.  Lab Results  Component Value Date   ALT 13 09/29/2014   AST 12 09/29/2014   ALKPHOS 76 09/29/2014   BILITOT 0.3 09/29/2014   Lab Results  Component Value Date   CHOL 164 11/04/2019   HDL 26 (L) 11/04/2019   LDLCALC 102 (H) 11/04/2019   TRIG 207 (H) 11/04/2019     3. Vitamin D deficiency He endorses fatigue, achiness.  Never on supp prior.   Discussed labs with pt and also this is NEW onset for pt.   Plan:   - Discussed importance of vitamin D to their health and well-being.   - possible symptoms of low Vitamin D can be low energy, depressed mood, muscle aches, joint aches, osteoporosis etc.  - low Vitamin D levels may be linked to an increased risk of cardiovascular events and even increased risk of cancers- such as colon and breast.   - I  recommend pt take a weekly prescription vit D- see script below    - this may be a lifelong thing, and we will need to monitor levels regularly to keep within normal limits.   - weight loss will likely improve availability of vitamin D, thus encouraged Bruce Pace to continue with meal plan and their weight loss efforts to further improve this condition  - reck labs q 3 mo or so  -Start Vitamin D, Ergocalciferol, (DRISDOL) 1.25 MG (50000 UNIT) CAPS capsule; Take 1 capsule (50,000 Units total) by mouth every 7 (seven) days.  Dispense: 4 capsule; Refill: 0    4. Class 3 severe obesity with serious comorbidity and body mass index (BMI) of 60.0 to 69.9 in adult, unspecified obesity type Bruce Pace)  Bruce Pace is currently in the action stage of change. As such, his goal is to continue with weight loss efforts. He has agreed to the Category 3 Plan.   Exercise goals: Increase to walking for 20 minutes 5 days per week if possible.  Behavioral modification strategies: increasing lean protein intake, decreasing simple carbohydrates, meal planning and cooking strategies and planning for success.  Bruce Pace has agreed to follow-up with our clinic in 2 weeks.   He was informed of the importance of frequent follow-up visits to maximize his success with intensive lifestyle modifications for his multiple health conditions.    Objective:   Blood pressure 102/62, pulse 80, temperature 99.1 F (37.3 C), height 5\' 6"  (1.676 m), weight (!) 375 lb (170.1 kg), SpO2 95 %. Body mass index is 60.53 kg/m.  General: Cooperative, alert, well developed, in no acute distress. HEENT: Conjunctivae and lids unremarkable. Cardiovascular: Regular rhythm.  Lungs: Normal work of breathing. Neurologic: No focal deficits.   Lab Results  Component Value Date   CREATININE 0.65 (L) 09/29/2014   BUN 12 09/29/2014   NA 139 09/29/2014   K 4.5 09/29/2014   CL 102 09/29/2014   CO2 29 09/29/2014   Lab Results  Component Value Date    ALT 13 09/29/2014   AST 12 09/29/2014   ALKPHOS 76 09/29/2014   BILITOT 0.3 09/29/2014   Lab Results  Component Value Date   HGBA1C 6.7 (H) 11/04/2019   HGBA1C 5.8 09/29/2014   HGBA1C 5.7 02/06/2013   Lab Results  Component Value Date   INSULIN 13.2 11/04/2019   Lab Results  Component Value Date   TSH 3.110 11/04/2019   Lab Results  Component Value Date   CHOL 164 11/04/2019   HDL 26 (L) 11/04/2019   LDLCALC 102 (H)  11/04/2019   TRIG 207 (H) 11/04/2019   Lab Results  Component Value Date   WBC 8.5 11/04/2019   HGB 14.1 11/04/2019   HCT 44.1 11/04/2019   MCV 83 11/04/2019   PLT 263 11/04/2019   Obesity Behavioral Intervention:   Approximately 15 minutes were spent on the discussion below.  ASK: We discussed the diagnosis of obesity with Marquita Palms today and Dequon agreed to give [place] permission to discuss obesity behavioral modification therapy today.  ASSESS: Jamil has the diagnosis of obesity and his BMI today is 60.7. Tauheed is in the action stage of change.   ADVISE: Cagney was educated on the multiple health risks of obesity as well as the benefit of weight loss to improve his health. He was advised of the need for long term treatment and the importance of lifestyle modifications to improve his current health and to decrease his risk of future health problems.  AGREE: Multiple dietary modification options and treatment options were discussed and Tyton agreed to follow the recommendations documented in the above note.  ARRANGE: Kobyn was educated on the importance of frequent visits to treat obesity as outlined per CMS and USPSTF guidelines and agreed to schedule his next follow up appointment today.  Attestation Statements:   Reviewed by clinician on day of visit: allergies, medications, problem list, medical history, surgical history, family history, social history, and previous encounter notes.  I, Insurance claims handler, CMA, am acting as Energy manager for [employer], DO.  I have reviewed the above documentation for accuracy and completeness, and I agree with the above. Carlye Grippe, D.O.  The 21st Century Cures Act was signed into law in 2016 which includes the topic of electronic health records.  This provides immediate access to information in MyChart.  This includes consultation notes, operative notes, office notes, lab results and pathology reports.  If you have any questions about what you read please let us know at your next visit so we can discuss your concerns and take corrective action if need be.  We are right here with you.

## 2019-11-27 ENCOUNTER — Encounter (INDEPENDENT_AMBULATORY_CARE_PROVIDER_SITE_OTHER): Payer: Self-pay

## 2019-12-08 ENCOUNTER — Encounter (INDEPENDENT_AMBULATORY_CARE_PROVIDER_SITE_OTHER): Payer: Self-pay | Admitting: Family Medicine

## 2019-12-08 ENCOUNTER — Ambulatory Visit (INDEPENDENT_AMBULATORY_CARE_PROVIDER_SITE_OTHER): Payer: BC Managed Care – PPO | Admitting: Family Medicine

## 2019-12-08 ENCOUNTER — Other Ambulatory Visit: Payer: Self-pay

## 2019-12-08 VITALS — BP 111/63 | HR 65 | Temp 97.8°F | Ht 66.0 in | Wt 370.0 lb

## 2019-12-08 DIAGNOSIS — M549 Dorsalgia, unspecified: Secondary | ICD-10-CM

## 2019-12-08 DIAGNOSIS — E559 Vitamin D deficiency, unspecified: Secondary | ICD-10-CM | POA: Diagnosis not present

## 2019-12-08 DIAGNOSIS — E1169 Type 2 diabetes mellitus with other specified complication: Secondary | ICD-10-CM

## 2019-12-08 DIAGNOSIS — E785 Hyperlipidemia, unspecified: Secondary | ICD-10-CM | POA: Diagnosis not present

## 2019-12-08 DIAGNOSIS — G8929 Other chronic pain: Secondary | ICD-10-CM

## 2019-12-08 DIAGNOSIS — Z9189 Other specified personal risk factors, not elsewhere classified: Secondary | ICD-10-CM

## 2019-12-08 DIAGNOSIS — Z6841 Body Mass Index (BMI) 40.0 and over, adult: Secondary | ICD-10-CM

## 2019-12-08 MED ORDER — VITAMIN D (ERGOCALCIFEROL) 1.25 MG (50000 UNIT) PO CAPS: 50000.0000 [IU] | ORAL_CAPSULE | ORAL | 0 refills | Status: DC

## 2019-12-08 MED ORDER — TRULICITY 1.5 MG/0.5ML ~~LOC~~ SOAJ: 1.5000 mg | SUBCUTANEOUS | 0 refills | Status: DC

## 2019-12-10 NOTE — Progress Notes (Signed)
Chief Complaint:   OBESITY Bruce Pace is here to discuss his progress with his obesity treatment plan along with follow-up of his obesity related diagnoses. Bruce Pace is on the Category 3 Plan and states he is following his eating plan approximately 85% of the time. Bruce Pace states he is walking 5 times per week.  Today's visit was #: 4 Starting weight: 379 lbs Starting date: 10/21/2019 Today's weight: 370 lbs Today's date: 12/08/2019 Total lbs lost to date: 9 lbs Total lbs lost since last in-office visit: 5 lbs Total weight loss percentage to date: -2.37%  Interim History:  Bruce Pace says he ate more on plan than prior, but still feels like it is too much.  If he feels full, he will not eat snacks.  No issues or questions.  Plan:  Follow-up with PCP in the near future as planned to discuss HLD treatment and back pain with her or Dr. Renaye Rakers from Orth[facility].  Assessment/Plan:   1. Type 2 diabetes mellitus with other specified complication, without long-term current use of insulin (HCC) Bruce Pace is taking metformin and Trulicity.  FBS was in the 200s, but has now improved and is in the 120s most of the time.  Denies lows or highs.  No symptoms or concerns.  Plan:  Refill Trulicity, as per below.  Lab Results  Component Value Date   HGBA1C 6.7 (H) 11/04/2019   HGBA1C 5.8 09/29/2014   HGBA1C 5.7 02/06/2013   Lab Results  Component Value Date   LDLCALC 102 (H) 11/04/2019   CREATININE 0.65 (L) 09/29/2014   Lab Results  Component Value Date   INSULIN 13.2 11/04/2019   -Refill Dulaglutide (TRULICITY) 1.5 MG/0.5ML SOPN; Inject 1.5 mg into the skin once a week.  Dispense: 2 mL; Refill: 0  2. Hyperlipidemia associated with type 2 diabetes mellitus (HCC) He has not followed up with his PCP yet regarding statin treatment for his HLD.  Plan:  Bruce Pace has an appointment later on this month with his PCP.  Encouraged to talk to her about starting a statin for cholesterol as well.  Lab Results   Component Value Date   ALT 13 09/29/2014   AST 12 09/29/2014   ALKPHOS 76 09/29/2014   BILITOT 0.3 09/29/2014   Lab Results  Component Value Date   CHOL 164 11/04/2019   HDL 26 (L) 11/04/2019   LDLCALC 102 (H) 11/04/2019   TRIG 207 (H) 11/04/2019   3. Vitamin D deficiency Bruce Pace's Vitamin D level was 8.0 on 11/04/2019. He is currently taking prescription vitamin D 50,000 IU each week. He denies nausea, vomiting or muscle weakness.  Tolerating well.  Plan:  Refill vitamin D, as per below.  -Refill Vitamin D, Ergocalciferol, (DRISDOL) 1.25 MG (50000 UNIT) CAPS capsule; Take 1 capsule (50,000 Units total) by mouth every 7 (seven) days.  Dispense: 4 capsule; Refill: 0  4. Chronic back pain, unspecified back location, unspecified back pain laterality He says this hinders his ability to walk/move.  He has never seen a doctor for this in the past.  Plan:  Recommend he follow-up with his PCP regarding back pain and obtain treatment so he can increase mobility/exercise.   5. Class 3 severe obesity with serious comorbidity and body mass index (BMI) of 50.0 to 59.9 in adult, unspecified obesity type Bruce Pace)  Bruce Pace is currently in the action stage of change. As such, his goal is to continue with weight loss efforts. He has agreed to the Category 3 Plan.   Exercise  goals: Start walking for 20 minutes 5 days per week.  Behavioral modification strategies: increasing lean protein intake, decreasing simple carbohydrates, holiday eating strategies  and planning for success.  Bruce Pace has agreed to follow-up with our clinic in 2 weeks. He was informed of the importance of frequent follow-up visits to maximize his success with intensive lifestyle modifications for his multiple health conditions.   Objective:   Blood pressure 111/63, pulse 65, temperature 97.8 F (36.6 C), height 5\' 6"  (1.676 m), weight (!) 370 lb (167.8 kg), SpO2 97 %. Body mass index is 59.72 kg/m.  General: Cooperative, alert,  well developed, in no acute distress. HEENT: Conjunctivae and lids unremarkable. Cardiovascular: Regular rhythm.  Lungs: Normal work of breathing. Neurologic: No focal deficits.   Lab Results  Component Value Date   CREATININE 0.65 (L) 09/29/2014   BUN 12 09/29/2014   NA 139 09/29/2014   K 4.5 09/29/2014   CL 102 09/29/2014   CO2 29 09/29/2014   Lab Results  Component Value Date   ALT 13 09/29/2014   AST 12 09/29/2014   ALKPHOS 76 09/29/2014   BILITOT 0.3 09/29/2014   Lab Results  Component Value Date   HGBA1C 6.7 (H) 11/04/2019   HGBA1C 5.8 09/29/2014   HGBA1C 5.7 02/06/2013   Lab Results  Component Value Date   INSULIN 13.2 11/04/2019   Lab Results  Component Value Date   TSH 3.110 11/04/2019   Lab Results  Component Value Date   CHOL 164 11/04/2019   HDL 26 (L) 11/04/2019   LDLCALC 102 (H) 11/04/2019   TRIG 207 (H) 11/04/2019   Lab Results  Component Value Date   WBC 8.5 11/04/2019   HGB 14.1 11/04/2019   HCT 44.1 11/04/2019   MCV 83 11/04/2019   PLT 263 11/04/2019   Obesity Behavioral Intervention:   Approximately 15 minutes were spent on the discussion below.  ASK: We discussed the diagnosis of obesity with 01/04/2020 today and Azlaan agreed to give Marquita Palms permission to discuss obesity behavioral modification therapy today.  ASSESS: Christepher has the diagnosis of obesity and his BMI today is 59.8. Corwin is in the action stage of change.   ADVISE: Cabell was educated on the multiple health risks of obesity as well as the benefit of weight loss to improve his health. He was advised of the need for long term treatment and the importance of lifestyle modifications to improve his current health and to decrease his risk of future health problems.  AGREE: Multiple dietary modification options and treatment options were discussed and Evyn agreed to follow the recommendations documented in the above note.  ARRANGE: Jaran was educated on the importance of frequent  visits to treat obesity as outlined per CMS and USPSTF guidelines and agreed to schedule his next follow up appointment today.  Attestation Statements:   Reviewed by clinician on day of visit: allergies, medications, problem list, medical history, surgical history, family history, social history, and previous encounter notes.  I, Marquita Palms, CMA, am acting as Insurance claims handler for Energy manager, DO.  I have reviewed the above documentation for accuracy and completeness, and I agree with the above. [employer], D.O.  The 21st Century Cures Act was signed into law in 2016 which includes the topic of electronic health records.  This provides immediate access to information in MyChart.  This includes consultation notes, operative notes, office notes, lab results and pathology reports.  If you have any questions about what you read please  let us know at your next visit so we can discuss your concerns and take corrective action if need be.  We are right here with you.

## 2019-12-15 DIAGNOSIS — Z23 Encounter for immunization: Secondary | ICD-10-CM | POA: Diagnosis not present

## 2019-12-15 DIAGNOSIS — M1711 Unilateral primary osteoarthritis, right knee: Secondary | ICD-10-CM | POA: Diagnosis not present

## 2019-12-15 DIAGNOSIS — E11628 Type 2 diabetes mellitus with other skin complications: Secondary | ICD-10-CM | POA: Diagnosis not present

## 2019-12-22 ENCOUNTER — Ambulatory Visit (INDEPENDENT_AMBULATORY_CARE_PROVIDER_SITE_OTHER): Payer: Medicare PPO | Admitting: Family Medicine

## 2019-12-22 ENCOUNTER — Other Ambulatory Visit: Payer: Self-pay

## 2019-12-22 ENCOUNTER — Encounter (INDEPENDENT_AMBULATORY_CARE_PROVIDER_SITE_OTHER): Payer: Self-pay | Admitting: Family Medicine

## 2019-12-22 VITALS — BP 102/64 | HR 73 | Temp 97.4°F | Ht 66.0 in | Wt 376.0 lb

## 2019-12-22 DIAGNOSIS — E559 Vitamin D deficiency, unspecified: Secondary | ICD-10-CM

## 2019-12-22 DIAGNOSIS — E1169 Type 2 diabetes mellitus with other specified complication: Secondary | ICD-10-CM

## 2019-12-22 DIAGNOSIS — Z9189 Other specified personal risk factors, not elsewhere classified: Secondary | ICD-10-CM | POA: Insufficient documentation

## 2019-12-22 DIAGNOSIS — Z6841 Body Mass Index (BMI) 40.0 and over, adult: Secondary | ICD-10-CM

## 2019-12-22 MED ORDER — VITAMIN D (ERGOCALCIFEROL) 1.25 MG (50000 UNIT) PO CAPS: 50000.0000 [IU] | ORAL_CAPSULE | ORAL | 0 refills | Status: DC

## 2019-12-23 NOTE — Progress Notes (Signed)
Chief Complaint:   OBESITY Bruce Pace is here to discuss his progress with his obesity treatment plan along with follow-up of his obesity related diagnoses. Bruce Pace is on the Category 3 Plan and states he is following his eating plan approximately 70% of the time. Bruce Pace states he is walking 10 minutes 5 times per week.  Today's visit was #: 5 Starting weight: 379 lbs Starting date: 10/21/2019 Today's weight: 376 lbs Today's date: 12/22/2019 Total lbs lost to date: 3 lbs Total lbs lost since last in-office visit: +6 lbs Total weight loss percentage to date: -0.79%  Interim History: Bruce Pace states the past 2 weeks he did not follow the plan. "I just slacked off." He did not weigh foods or proteins like he was previously. He ran out of his Trulicity and has not taken it in a week.  Plan: Bruce Pace is only drinking 1 to 2 bottles of water a day. His goal is to drink 1 gallon of water a day.  Assessment/Plan:   1. Type 2 diabetes mellitus with other specified complication, without long-term current use of insulin (HCC) Bruce Pace has been off Trulicity for 1 week. His fasting blood sugars have been 150, 170,180, 142, 144, 180, and 156. We increased trulicity dose at his last office visit but he did not get the new prescription yet, however, he did use up the old medication.  Plan: Bruce Pace will get all medication and take it as prescribed. Check with PCP regarding Metformin dose that she wants Bruce Pace to take. Continue home blood sugar monitoring.  2. Vitamin D deficiency Bruce Pace Vitamin D level was 8.0 on 11/04/2019. He is currently taking prescription vitamin D 50,000 IU each week. He denies nausea, vomiting or muscle weakness.  Plan: Refill Vit D for 1 month, as per below.  Refill- Vitamin D, Ergocalciferol, (DRISDOL) 1.25 MG (50000 UNIT) CAPS capsule; Take 1 capsule (50,000 Units total) by mouth every 7 (seven) days.  Dispense: 4 capsule; Refill: 0  3. Class 3 severe obesity with serious comorbidity  and body mass index (BMI) of 60.0 to 69.9 in adult, unspecified obesity type Bruce Pace) Bruce Pace is currently in the action stage of change. As such, his goal is to continue with weight loss efforts. He has agreed to the Category 3 Plan.   Exercise goals: As is  Behavioral modification strategies: increasing water intake, meal planning and cooking strategies, celebration eating strategies and planning for success.  Jahleel has agreed to follow-up with our clinic in 2 weeks. He was informed of the importance of frequent follow-up visits to maximize his success with intensive lifestyle modifications for his multiple health conditions.   Objective:   Blood pressure 102/64, pulse 73, temperature (!) 97.4 F (36.3 C), height 5\' 6"  (1.676 m), weight (!) 376 lb (170.6 kg), SpO2 96 %. Body mass index is 60.69 kg/m.  General: Cooperative, alert, well developed, in no acute distress. HEENT: Conjunctivae and lids unremarkable. Cardiovascular: Regular rhythm.  Lungs: Normal work of breathing. Neurologic: No focal deficits.   Lab Results  Component Value Date   CREATININE 0.65 (L) 09/29/2014   BUN 12 09/29/2014   NA 139 09/29/2014   K 4.5 09/29/2014   CL 102 09/29/2014   CO2 29 09/29/2014   Lab Results  Component Value Date   ALT 13 09/29/2014   AST 12 09/29/2014   ALKPHOS 76 09/29/2014   BILITOT 0.3 09/29/2014   Lab Results  Component Value Date   HGBA1C 6.7 (H) 11/04/2019   HGBA1C  5.8 09/29/2014   HGBA1C 5.7 02/06/2013   Lab Results  Component Value Date   INSULIN 13.2 11/04/2019   Lab Results  Component Value Date   TSH 3.110 11/04/2019   Lab Results  Component Value Date   CHOL 164 11/04/2019   HDL 26 (L) 11/04/2019   LDLCALC 102 (H) 11/04/2019   TRIG 207 (H) 11/04/2019   Lab Results  Component Value Date   WBC 8.5 11/04/2019   HGB 14.1 11/04/2019   HCT 44.1 11/04/2019   MCV 83 11/04/2019   PLT 263 11/04/2019    Attestation Statements:   Reviewed by clinician on day  of visit: allergies, medications, problem list, medical history, surgical history, family history, social history, and previous encounter notes.   Edmund Hilda, am acting as Energy manager for [employer], DO.  I have reviewed the above documentation for accuracy and completeness, and I agree with the above. Carlye Grippe, D.O.  The 21st Century Cures Act was signed into law in 2016 which includes the topic of electronic health records.  This provides immediate access to information in MyChart.  This includes consultation notes, operative notes, office notes, lab results and pathology reports.  If you have any questions about what you read please let us know at your next visit so we can discuss your concerns and take corrective action if need be.  We are right here with you.

## 2020-01-06 ENCOUNTER — Encounter (INDEPENDENT_AMBULATORY_CARE_PROVIDER_SITE_OTHER): Payer: Self-pay | Admitting: Family Medicine

## 2020-01-06 ENCOUNTER — Other Ambulatory Visit: Payer: Self-pay

## 2020-01-06 ENCOUNTER — Ambulatory Visit (INDEPENDENT_AMBULATORY_CARE_PROVIDER_SITE_OTHER): Payer: Self-pay | Admitting: Family Medicine

## 2020-01-06 VITALS — BP 99/62 | HR 70 | Temp 98.0°F | Ht 66.0 in | Wt 370.0 lb

## 2020-01-06 DIAGNOSIS — E559 Vitamin D deficiency, unspecified: Secondary | ICD-10-CM

## 2020-01-06 DIAGNOSIS — E1169 Type 2 diabetes mellitus with other specified complication: Secondary | ICD-10-CM

## 2020-01-06 DIAGNOSIS — Z6841 Body Mass Index (BMI) 40.0 and over, adult: Secondary | ICD-10-CM

## 2020-01-06 MED ORDER — TRULICITY 1.5 MG/0.5ML ~~LOC~~ SOAJ: 1.5000 mg | SUBCUTANEOUS | 0 refills | Status: AC

## 2020-01-06 NOTE — Progress Notes (Signed)
Chief Complaint:   OBESITY Bruce Pace is here to discuss his progress with his obesity treatment plan along with follow-up of his obesity related diagnoses. Bruce Pace is on the Category 3 Plan and states he is following his eating plan approximately 80% of the time. Bruce Pace states he is walking 5 minutes 4 times per week.  Today's visit was #: 6 Starting weight: 379 lbs Starting date: 10/21/2019 Today's weight: 370 lbs Today's date: 01/06/2020 Total lbs lost to date: 9 lbs Total lbs lost since last in-office visit: 6 lbs Total weight loss percentage to date: -2.37%  Interim History: Bruce Pace reports that he worked at the plan more. He ate on the plan and tried to move more. He walked more using his cane for stability and says he's walking an average of 10 minutes 4 days a week. Bruce Pace saw his PCP (at [facility]) at the end of November and states no labs were done, but he was scheduled for a sleep study and colonoscopy. He will have CPE with prostate exam in the new year.  Assessment/Plan:   1. Type 2 diabetes mellitus with other specified complication, without long-term current use of insulin (HCC) Bruce Pace reports fasting blood sugars 118-181. He notes that he is still taking 0.75 mg once a week.   Lab Results  Component Value Date   HGBA1C 6.7 (H) 11/04/2019   Lab Results  Component Value Date   NA 139 09/29/2014   K 4.5 09/29/2014   CO2 29 09/29/2014   GLUCOSE 93 09/29/2014   BUN 12 09/29/2014   CREATININE 0.65 (L) 09/29/2014   CALCIUM 9.2 09/29/2014   GFRNONAA >89 09/29/2014   GFRAA >89 09/29/2014    Plan: Refill Trulicity for 1 month supply, as per below.  Refill- Dulaglutide (TRULICITY) 1.5 MG/0.5ML SOPN; Inject 1.5 mg into the skin once a week.  Dispense: 2 mL; Refill: 0  2. Vitamin D deficiency Bruce Pace's Vitamin D level was 8.0 on 11/04/2019. He is currently taking prescription vitamin D 50,000 IU each week. He denies nausea, vomiting or muscle weakness.  Plan: Continue current  treatment plan. Bruce Pace denies need for refill.  - Reiterated importance of vitamin D (as well as calcium) to their health and wellbeing.  - Reminded Bruce Pace that weight loss will likely improve availability of vitamin D, thus encouraged him to continue with meal plan and their weight loss efforts to further improve this condition. - I recommend patient continue to take weekly prescription vit D 50,000 IU - Informed patient this may be a lifelong thing, and he was encouraged to continue to take the medicine until told otherwise.   - we will need to monitor levels regularly (every 3-4 mo on average) to keep levels within normal limits.  - weight loss will likely improve availability of vitamin D, thus encouraged Bruce Pace to continue with meal plan and their weight loss efforts to further improve this condition - pt's questions and concerns regarding this condition addressed.   3. Class 3 severe obesity with serious comorbidity and body mass index (BMI) of 50.0 to 59.9 in adult, unspecified obesity type [facility]) Bruce Pace is currently in the action stage of change. As such, his goal is to continue with weight loss efforts. He has agreed to the Category 3 Plan.   Exercise goals: For substantial health benefits, adults should do at least 150 minutes (2 hours and 30 minutes) a week of moderate-intensity, or 75 minutes (1 hour and 15 minutes) a week of vigorous-intensity  aerobic physical activity, or an equivalent combination of moderate- and vigorous-intensity aerobic activity. Aerobic activity should be performed in episodes of at least 10 minutes, and preferably, it should be spread throughout the week.  Behavioral modification strategies: meal planning and cooking strategies and planning for success.  Bruce Pace has agreed to follow-up with our clinic in 3 weeks. He was informed of the importance of frequent follow-up visits to maximize his success with intensive lifestyle modifications for his multiple health  conditions.   Objective:   Blood pressure 99/62, pulse 70, temperature 98 F (36.7 C), height 5\' 6"  (1.676 m), weight (!) 370 lb (167.8 kg), SpO2 96 %. Body mass index is 59.72 kg/m.  General: Cooperative, alert, well developed, in no acute distress. HEENT: Conjunctivae and lids unremarkable. Cardiovascular: Regular rhythm.  Lungs: Normal work of breathing. Neurologic: No focal deficits.   Lab Results  Component Value Date   CREATININE 0.65 (L) 09/29/2014   BUN 12 09/29/2014   NA 139 09/29/2014   K 4.5 09/29/2014   CL 102 09/29/2014   CO2 29 09/29/2014   Lab Results  Component Value Date   ALT 13 09/29/2014   AST 12 09/29/2014   ALKPHOS 76 09/29/2014   BILITOT 0.3 09/29/2014   Lab Results  Component Value Date   HGBA1C 6.7 (H) 11/04/2019   HGBA1C 5.8 09/29/2014   HGBA1C 5.7 02/06/2013   Lab Results  Component Value Date   INSULIN 13.2 11/04/2019   Lab Results  Component Value Date   TSH 3.110 11/04/2019   Lab Results  Component Value Date   CHOL 164 11/04/2019   HDL 26 (L) 11/04/2019   LDLCALC 102 (H) 11/04/2019   TRIG 207 (H) 11/04/2019   Lab Results  Component Value Date   WBC 8.5 11/04/2019   HGB 14.1 11/04/2019   HCT 44.1 11/04/2019   MCV 83 11/04/2019   PLT 263 11/04/2019    Obesity Behavioral Intervention:   Approximately 15 minutes were spent on the discussion below.  ASK: We discussed the diagnosis of obesity with 01/04/2020 today and Mustaf agreed to give Bruce Pace permission to discuss obesity behavioral modification therapy today.  ASSESS: Bruce Pace has the diagnosis of obesity and his BMI today is 59.8. Bruce Pace is in the action stage of change.   ADVISE: Bruce Pace was educated on the multiple health risks of obesity as well as the benefit of weight loss to improve his health. He was advised of the need for long term treatment and the importance of lifestyle modifications to improve his current health and to decrease his risk of future health  problems.  AGREE: Multiple dietary modification options and treatment options were discussed and Bruce Pace agreed to follow the recommendations documented in the above note.  ARRANGE: Bruce Pace was educated on the importance of frequent visits to treat obesity as outlined per CMS and USPSTF guidelines and agreed to schedule his next follow up appointment today.  Attestation Statements:   Reviewed by clinician on day of visit: allergies, medications, problem list, medical history, surgical history, family history, social history, and previous encounter notes.  Bruce Pace, am acting as Bruce Pace for Energy manager, DO.  I have reviewed the above documentation for accuracy and completeness, and I agree with the above. Bruce Pace, D.O.  The 21st Century Cures Act was signed into law in 2016 which includes the topic of electronic health records.  This provides immediate access to information in MyChart.  This includes consultation notes, operative notes,  office notes, lab results and pathology reports.  If you have any questions about what you read please let us know at your next visit so we can discuss your concerns and take corrective action if need be.  We are right here with you.

## 2020-01-27 ENCOUNTER — Encounter (INDEPENDENT_AMBULATORY_CARE_PROVIDER_SITE_OTHER): Payer: Self-pay | Admitting: Family Medicine

## 2020-01-27 ENCOUNTER — Ambulatory Visit (INDEPENDENT_AMBULATORY_CARE_PROVIDER_SITE_OTHER): Payer: Self-pay | Admitting: Family Medicine

## 2020-01-27 ENCOUNTER — Other Ambulatory Visit: Payer: Self-pay

## 2020-01-27 VITALS — BP 113/69 | HR 72 | Temp 98.1°F | Wt 378.0 lb

## 2020-01-27 DIAGNOSIS — E559 Vitamin D deficiency, unspecified: Secondary | ICD-10-CM | POA: Diagnosis not present

## 2020-01-27 DIAGNOSIS — Z6841 Body Mass Index (BMI) 40.0 and over, adult: Secondary | ICD-10-CM | POA: Diagnosis not present

## 2020-01-27 DIAGNOSIS — E1165 Type 2 diabetes mellitus with hyperglycemia: Secondary | ICD-10-CM

## 2020-01-27 MED ORDER — VITAMIN D (ERGOCALCIFEROL) 1.25 MG (50000 UNIT) PO CAPS: 50000.0000 [IU] | ORAL_CAPSULE | ORAL | 0 refills | Status: AC

## 2020-01-28 NOTE — Progress Notes (Signed)
Chief Complaint:   OBESITY Bruce Pace is here to discuss his progress with his obesity treatment plan along with follow-up of his obesity related diagnoses. [name] is on the Category 3 Plan and states he is following his eating plan approximately 80% of the time. [name] states he is walking 10 minutes 5 times per week.  Today's visit was #: 7 Starting weight: 379 lbs Starting date: 10/21/2019 Today's weight: 378 lbs Today's date: 01/27/2020 Total lbs lost to date: 1 lb Total lbs lost since last in-office visit: 0  Interim History: Bruce Pace has been following meal plan 80% of time, not necessarily all meals, 2 meals but occasionally 3. Often eating breakfast, dinner and then doing a fruit snack in between.  Subjective:   1. Vitamin D deficiency Bruce Pace's Vitamin D level was 8.0 on 11/04/2019. He is currently taking prescription vitamin D 50,000 IU each week. He denies nausea, vomiting or muscle weakness.  He endorses fatigue.  2. Type 2 diabetes mellitus with hyperglycemia, without long-term current use of insulin (HCC) No feeling of hypoglycemia on Trulicity 1.5 mg.  Lab Results  Component Value Date   HGBA1C 6.7 (H) 11/04/2019   HGBA1C 5.8 09/29/2014   HGBA1C 5.7 02/06/2013   Lab Results  Component Value Date   LDLCALC 102 (H) 11/04/2019   CREATININE 0.65 (L) 09/29/2014   Lab Results  Component Value Date   INSULIN 13.2 11/04/2019   Assessment/Plan:   1. Vitamin D deficiency Low Vitamin D level contributes to fatigue and are associated with obesity, breast, and colon cancer. He agrees to continue to take prescription Vitamin D @50 ,000 IU every week and will follow-up for routine testing of Vitamin D, at least 2-3 times per year to avoid over-replacement.  - Vitamin D, Ergocalciferol, (DRISDOL) 1.25 MG (50000 UNIT) CAPS capsule; Take 1 capsule (50,000 Units total) by mouth every 7 (seven) days.  Dispense: 4 capsule; Refill: 0  2. Type 2 diabetes mellitus with hyperglycemia,  without long-term current use of insulin (HCC) Continue current medication. No refill needed. May need to consider increasing dose.  3. Class 3 severe obesity with serious comorbidity and body mass index (BMI) of 60.0 to 69.9 in adult, unspecified obesity type [facility])  Bruce Pace is currently in the action stage of change. As such, his goal is to continue with weight loss efforts. He has agreed to the Category 3 Plan.   Exercise goals: All adults should avoid inactivity. Some physical activity is better than none, and adults who participate in any amount of physical activity gain some health benefits.  Behavioral modification strategies: increasing lean protein intake, no skipping meals, meal planning and cooking strategies and keeping healthy foods in the home.  Giuliano has agreed to follow-up with our clinic in 2 weeks with Dr. Marquita Palms. He was informed of the importance of frequent follow-up visits to maximize his success with intensive lifestyle modifications for his multiple health conditions.    Objective:   Blood pressure 113/69, pulse 72, temperature 98.1 F (36.7 C), temperature source Oral, weight (!) 378 lb (171.5 kg), SpO2 93 %. Body mass index is 61.01 kg/m.  General: Cooperative, alert, well developed, in no acute distress. HEENT: Conjunctivae and lids unremarkable. Cardiovascular: Regular rhythm.  Lungs: Normal work of breathing. Neurologic: No focal deficits.   Lab Results  Component Value Date   CREATININE 0.65 (L) 09/29/2014   BUN 12 09/29/2014   NA 139 09/29/2014   K 4.5 09/29/2014   CL 102 09/29/2014  CO2 29 09/29/2014   Lab Results  Component Value Date   ALT 13 09/29/2014   AST 12 09/29/2014   ALKPHOS 76 09/29/2014   BILITOT 0.3 09/29/2014   Lab Results  Component Value Date   HGBA1C 6.7 (H) 11/04/2019   HGBA1C 5.8 09/29/2014   HGBA1C 5.7 02/06/2013   Lab Results  Component Value Date   INSULIN 13.2 11/04/2019   Lab Results  Component Value Date    TSH 3.110 11/04/2019   Lab Results  Component Value Date   CHOL 164 11/04/2019   HDL 26 (L) 11/04/2019   LDLCALC 102 (H) 11/04/2019   TRIG 207 (H) 11/04/2019   Lab Results  Component Value Date   WBC 8.5 11/04/2019   HGB 14.1 11/04/2019   HCT 44.1 11/04/2019   MCV 83 11/04/2019   PLT 263 11/04/2019   No results found for: IRON, TIBC, FERRITIN  I, Delorse Limber, am acting as transcriptionist for Reuben Likes, MD.  I have reviewed the above documentation for accuracy and completeness, and I agree with the above. - Katherina Mires, MD

## 2020-02-06 DIAGNOSIS — L304 Erythema intertrigo: Secondary | ICD-10-CM | POA: Diagnosis not present

## 2020-02-06 DIAGNOSIS — M545 Low back pain, unspecified: Secondary | ICD-10-CM | POA: Diagnosis not present

## 2020-02-09 ENCOUNTER — Telehealth (INDEPENDENT_AMBULATORY_CARE_PROVIDER_SITE_OTHER): Payer: Self-pay

## 2020-02-09 ENCOUNTER — Telehealth (INDEPENDENT_AMBULATORY_CARE_PROVIDER_SITE_OTHER): Payer: Medicare PPO | Admitting: Family Medicine

## 2020-02-09 NOTE — Telephone Encounter (Signed)
Call to patient to get my chart video visit started.  Pt not signed up for my chart.   Will try again to call later.

## 2020-02-09 NOTE — Telephone Encounter (Signed)
Call to patient several times, unable to reach.   Pt is not signed up for my chart, unable to send a message.  Call pt home and cell phone numbers, no answer, left a message.

## 2020-03-12 DIAGNOSIS — M545 Low back pain, unspecified: Secondary | ICD-10-CM | POA: Diagnosis not present

## 2020-03-15 ENCOUNTER — Other Ambulatory Visit: Payer: Self-pay | Admitting: Specialist

## 2020-03-15 DIAGNOSIS — M545 Low back pain, unspecified: Secondary | ICD-10-CM

## 2020-03-23 DIAGNOSIS — I872 Venous insufficiency (chronic) (peripheral): Secondary | ICD-10-CM | POA: Diagnosis not present

## 2020-03-23 DIAGNOSIS — Z1211 Encounter for screening for malignant neoplasm of colon: Secondary | ICD-10-CM | POA: Diagnosis not present

## 2020-03-23 DIAGNOSIS — G4733 Obstructive sleep apnea (adult) (pediatric): Secondary | ICD-10-CM | POA: Diagnosis not present

## 2020-03-23 DIAGNOSIS — Z1389 Encounter for screening for other disorder: Secondary | ICD-10-CM | POA: Diagnosis not present

## 2020-03-23 DIAGNOSIS — Z0000 Encounter for general adult medical examination without abnormal findings: Secondary | ICD-10-CM | POA: Diagnosis not present

## 2020-03-23 DIAGNOSIS — E11628 Type 2 diabetes mellitus with other skin complications: Secondary | ICD-10-CM | POA: Diagnosis not present

## 2020-03-23 DIAGNOSIS — E559 Vitamin D deficiency, unspecified: Secondary | ICD-10-CM | POA: Diagnosis not present

## 2020-03-23 DIAGNOSIS — Z1159 Encounter for screening for other viral diseases: Secondary | ICD-10-CM | POA: Diagnosis not present

## 2020-03-26 DIAGNOSIS — E119 Type 2 diabetes mellitus without complications: Secondary | ICD-10-CM | POA: Diagnosis not present

## 2020-03-26 DIAGNOSIS — Z7984 Long term (current) use of oral hypoglycemic drugs: Secondary | ICD-10-CM | POA: Diagnosis not present

## 2020-03-26 DIAGNOSIS — G4733 Obstructive sleep apnea (adult) (pediatric): Secondary | ICD-10-CM | POA: Diagnosis not present

## 2020-03-31 ENCOUNTER — Encounter (HOSPITAL_BASED_OUTPATIENT_CLINIC_OR_DEPARTMENT_OTHER): Payer: Self-pay

## 2020-03-31 DIAGNOSIS — G4733 Obstructive sleep apnea (adult) (pediatric): Secondary | ICD-10-CM

## 2020-04-01 ENCOUNTER — Ambulatory Visit
Admission: RE | Admit: 2020-04-01 | Discharge: 2020-04-01 | Disposition: A | Discharge status: Home / Self Care | Payer: Medicare PPO | Source: Ambulatory Visit | Attending: Orthopedic Surgery | Admitting: Orthopedic Surgery

## 2020-04-01 ENCOUNTER — Other Ambulatory Visit: Payer: Self-pay

## 2020-04-01 DIAGNOSIS — M545 Low back pain, unspecified: Secondary | ICD-10-CM

## 2020-04-15 DIAGNOSIS — M545 Low back pain, unspecified: Secondary | ICD-10-CM | POA: Diagnosis not present

## 2020-04-19 ENCOUNTER — Other Ambulatory Visit: Payer: Self-pay | Admitting: Gastroenterology

## 2020-04-19 DIAGNOSIS — Z01818 Encounter for other preprocedural examination: Secondary | ICD-10-CM | POA: Diagnosis not present

## 2020-04-26 DIAGNOSIS — M47816 Spondylosis without myelopathy or radiculopathy, lumbar region: Secondary | ICD-10-CM | POA: Diagnosis not present

## 2020-05-12 DIAGNOSIS — M25562 Pain in left knee: Secondary | ICD-10-CM | POA: Diagnosis not present

## 2020-05-15 ENCOUNTER — Other Ambulatory Visit: Payer: Self-pay

## 2020-05-15 ENCOUNTER — Ambulatory Visit (HOSPITAL_BASED_OUTPATIENT_CLINIC_OR_DEPARTMENT_OTHER): Payer: BC Managed Care – PPO | Attending: Internal Medicine | Admitting: Internal Medicine

## 2020-05-15 VITALS — Temp 97.7°F | Ht 66.0 in | Wt 370.0 lb

## 2020-05-15 DIAGNOSIS — R0902 Hypoxemia: Secondary | ICD-10-CM | POA: Diagnosis not present

## 2020-05-15 DIAGNOSIS — Z6841 Body Mass Index (BMI) 40.0 and over, adult: Secondary | ICD-10-CM | POA: Insufficient documentation

## 2020-05-15 DIAGNOSIS — G4733 Obstructive sleep apnea (adult) (pediatric): Secondary | ICD-10-CM | POA: Insufficient documentation

## 2020-05-16 DIAGNOSIS — G4733 Obstructive sleep apnea (adult) (pediatric): Secondary | ICD-10-CM | POA: Diagnosis not present

## 2020-05-16 NOTE — Procedures (Signed)
NAME: Bruce Pace DATE OF BIRTH:  [birth year] MEDICAL RECORD NUMBER [id number]  LOCATION: [facility]  PHYSICIAN: Deretha Emory  DATE OF STUDY: 05/15/2020  SLEEP STUDY TYPE: SPLIT night study               REFERRING PHYSICIAN: Tempie Hoist, MD  EPWORTH SLEEPINESS SCORE:  12 HEIGHT: 5\' 6"  (167.6 cm)  WEIGHT: (!) 370 lb (167.8 kg)    Body mass index is 59.72 kg/m.  NECK SIZE: 19.5 in.  CLINICAL INFORMATION The patient was referred to the sleep center for evaluation of a history of OSA. He uses PAP therapy for a few years and stopped it for unclear reasons. Weight has varied widely during this time. He has excessive daytime sleepiness. Study is done to assess severity of OSA and titrate PAP if possible.  MEDICATIONS No sleep medicine administered.  SLEEP STUDY TECHNIQUE The patient underwent an attended overnight level one polysomnography titration to assess the effects of cpap therapy. The following variables were monitored: EEG (C4-A1, C3-A2, O1-A2, O2-A1), EOG, submental and leg EMG, ECG, oxyhemoglobin saturation by pulse oximetry, thoracic and abdominal respiratory effort belts, nasal/oral airflow by pressure sensor, body position sensor and snoring sensor. CPAP pressure was titrated to eliminate apneas, hypopneas and oxygen desaturation. The NPSG portion of the study ended at 1:55:47 AM . The BiPAP titration was initiated at 2:01:49 AM AM with the CPAP portion of the study ending at 5:27:29 AM.  TECHNICAL COMMENTS Comments added by Technician: NONE Comments added by Scorer: N/A  SLEEP ARCHITECTURE The recording time for the entire night was 435.5 minutes. The diagnostic portion was initiated at 10:11:57 PM and terminated at 1:55:47 AM. The time in bed was 223.8 minutes. EEG confirmed total sleep time was 152.5 minutes yielding a sleep efficiency of 68.1%%. Sleep onset after lights out was 9.7 minutes. He did not enter REM sleep. The patient spent  34.1%% of the night in stage N1 sleep, 65.9%% in stage N2 sleep, 0.0%% in stage N3 and 0% in REM. The Arousal Index was 104.7/hour.  The titration portion was initiated at 2:01:49 AM and terminated at 5:27:29 AM. The time in bed was 205.7 minutes. EEG confirmed total sleep time was 202.3 minutes yielding a sleep efficiency of 98.4%%. Sleep onset after CPAP initiation was 0.3 minutes with a REM latency of 30.0 minutes. The patient spent 1.7%% of the night in stage N1 sleep, 58.7%% in stage N2 sleep, 0.0%% in stage N3 and 39.5% in REM. The Arousal Index was 4.4/hour. There was evidence of REM rebound  RESPIRATORY PARAMETERS During the diagnostic portion, there were a total of 281 respiratory disturbances recorded; 26 apneas ( 11 obstructive, 12 mixed, 3 central), 248 hypopneas and 7 RERAs. The apnea/hypopnea index 107.8 was events/hour and the RDI was 110.6 events/hour. The central sleep apnea index was 1.2 events/hour. The REM AHI was N/A /h and NREM AHI was 107.8/h. The REM RDI was 0.0 /h and NREM RDI was 110.6 /h. The supine AHI was 103.5/h, and the non supine AHI was 108.4/hr. He was supine during 11.4%% of sleep. The supine RDI was 113.9/h, and the non supine RDI was 110.13/h. Respiratory disturbances were associated with oxygen desaturation down to a nadir of 73.0 % during sleep. The mean oxygen saturation during the study was 88.8%. The cumulative time under 88% oxygen saturation was 62.5 minutes.  He was started on CPAP 7 cm H20 and titrated to airway control. Central sleep apnea did not  emerge. The most appropriate setting of CPAP was 14 cm H2O. At this setting, the sleep efficiency was 99% and the patient was supine for 66%. The AHI was 0 events per hour(with 0 central events) at this setting. Oxygen nadir was 91.0% at optimal setting.   LEG MOVEMENT DATA The periodic limb movement index was 0.0/hour with an associated arousal index of /hour.  CARDIAC DATA The underlying cardiac rhythm was most  consistent with sinus rhythm. Mean heart rate was 66.3 during diagnostic portion and 65.7 during titration portion of study. Additional rhythm abnormalities include PVCs.  IMPRESSIONS - Severe Obstructive Sleep Apnea (OSA). Optimal pressure attained. - No Significant Central Sleep Apnea (CSA) - Severe hypoxemia resolved with CPAP.  - REM rebound during titration - No significant periodic leg movements(PLMs) during sleep.  DIAGNOSIS - Obstructive Sleep Apnea (G47.33)  RECOMMENDATIONS - Trial of CPAP therapy on 14 cm H2O with a Large size Resmed Full Face Mask AirFit F20 mask and heated humidification. Alternatively, auto adjusting CPAP could be considered.   Deretha Emory Sleep speicialist, American Board of Intenal Medicine  ELECTRONICALLY SIGNED ON:  05/16/2020, 2:12 PM [facility] PH: [phone]   FX: [phone] ACCREDITED BY THE AMERICAN ACADEMY OF SLEEP MEDICINE

## 2020-05-17 ENCOUNTER — Other Ambulatory Visit (HOSPITAL_BASED_OUTPATIENT_CLINIC_OR_DEPARTMENT_OTHER): Payer: Self-pay

## 2020-05-17 DIAGNOSIS — M25562 Pain in left knee: Secondary | ICD-10-CM | POA: Diagnosis not present

## 2020-05-17 DIAGNOSIS — M25561 Pain in right knee: Secondary | ICD-10-CM | POA: Diagnosis not present

## 2020-05-17 DIAGNOSIS — G4733 Obstructive sleep apnea (adult) (pediatric): Secondary | ICD-10-CM

## 2020-06-08 ENCOUNTER — Ambulatory Visit (HOSPITAL_COMMUNITY): Admit: 2020-06-08 | Payer: Medicare PPO | Admitting: Gastroenterology

## 2020-06-08 ENCOUNTER — Encounter (HOSPITAL_COMMUNITY): Payer: Self-pay

## 2020-06-08 SURGERY — COLONOSCOPY WITH PROPOFOL
Anesthesia: Monitor Anesthesia Care

## 2020-06-14 DIAGNOSIS — G4733 Obstructive sleep apnea (adult) (pediatric): Secondary | ICD-10-CM | POA: Diagnosis not present

## 2020-07-15 DIAGNOSIS — G4733 Obstructive sleep apnea (adult) (pediatric): Secondary | ICD-10-CM | POA: Diagnosis not present

## 2020-07-23 DIAGNOSIS — E7800 Pure hypercholesterolemia, unspecified: Secondary | ICD-10-CM | POA: Diagnosis not present

## 2020-07-23 DIAGNOSIS — Z6841 Body Mass Index (BMI) 40.0 and over, adult: Secondary | ICD-10-CM | POA: Diagnosis not present

## 2020-07-23 DIAGNOSIS — I872 Venous insufficiency (chronic) (peripheral): Secondary | ICD-10-CM | POA: Diagnosis not present

## 2020-07-23 DIAGNOSIS — E559 Vitamin D deficiency, unspecified: Secondary | ICD-10-CM | POA: Diagnosis not present

## 2020-07-23 DIAGNOSIS — Z7984 Long term (current) use of oral hypoglycemic drugs: Secondary | ICD-10-CM | POA: Diagnosis not present

## 2020-07-23 DIAGNOSIS — E11628 Type 2 diabetes mellitus with other skin complications: Secondary | ICD-10-CM | POA: Diagnosis not present

## 2020-08-14 DIAGNOSIS — G4733 Obstructive sleep apnea (adult) (pediatric): Secondary | ICD-10-CM | POA: Diagnosis not present

## 2020-09-14 DIAGNOSIS — G4733 Obstructive sleep apnea (adult) (pediatric): Secondary | ICD-10-CM | POA: Diagnosis not present

## 2020-10-15 DIAGNOSIS — G4733 Obstructive sleep apnea (adult) (pediatric): Secondary | ICD-10-CM | POA: Diagnosis not present

## 2020-11-14 DIAGNOSIS — G4733 Obstructive sleep apnea (adult) (pediatric): Secondary | ICD-10-CM | POA: Diagnosis not present

## 2020-12-15 DIAGNOSIS — G4733 Obstructive sleep apnea (adult) (pediatric): Secondary | ICD-10-CM | POA: Diagnosis not present

## 2021-01-14 DIAGNOSIS — G4733 Obstructive sleep apnea (adult) (pediatric): Secondary | ICD-10-CM | POA: Diagnosis not present

## 2021-02-14 DIAGNOSIS — G4733 Obstructive sleep apnea (adult) (pediatric): Secondary | ICD-10-CM | POA: Diagnosis not present

## 2021-03-17 DIAGNOSIS — G4733 Obstructive sleep apnea (adult) (pediatric): Secondary | ICD-10-CM | POA: Diagnosis not present

## 2021-05-04 IMAGING — MR MR LUMBAR SPINE W/O CM
4 of 5 series · 19 of 48 positions shown · non-contrast
Comparison: None.

CLINICAL DATA: Bilateral knee pain

EXAM:
MRI LUMBAR SPINE WITHOUT CONTRAST
TECHNIQUE: Multiplanar, multisequence MR imaging of the lumbar spine was
performed. No intravenous contrast was administered.

[Series 100: T2 · sagittal · 4.0mm · 0.73mm/px · 5 of 15 slices shown (1 of 2)]
[im 1/15]
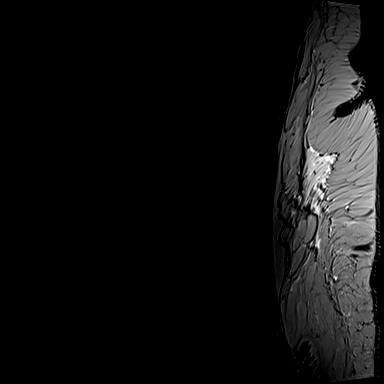
[im 4/15]
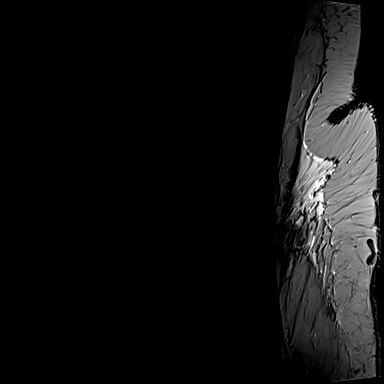
[im 8/15]
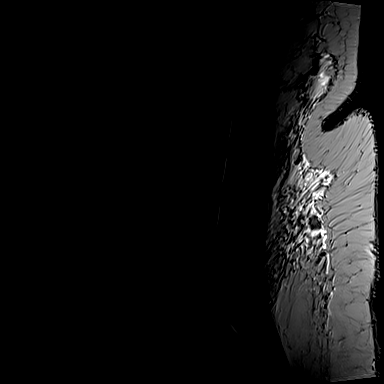
[im 11/15]
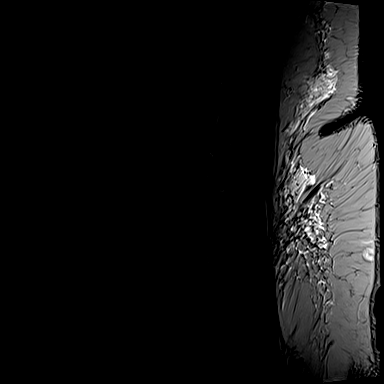
[im 15/15]
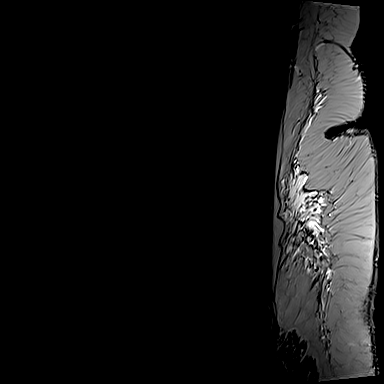

[Series 101: T1 · sagittal · 4.0mm · 0.73mm/px · 3 of 15 slices shown (1 of 2)]
[im 1/15]
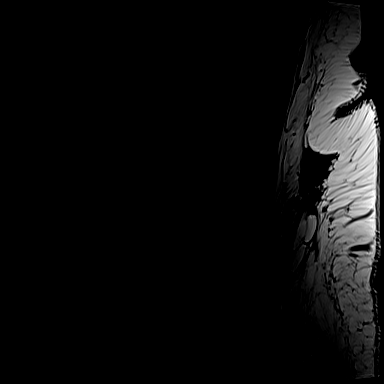
[im 8/15]
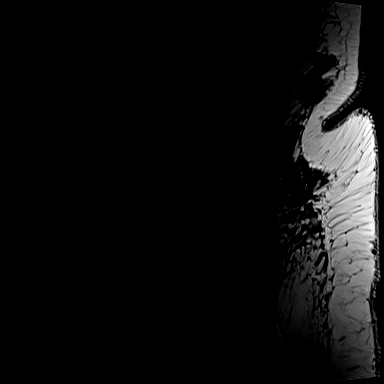
[im 15/15]
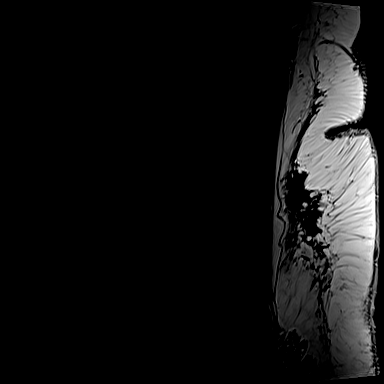

[Series 103: T1 · axial · 4.0mm · 0.28mm/px · z∈[-13,+148]mm · 3 of 42 slices shown (2 of 2)]
[im 6/42]
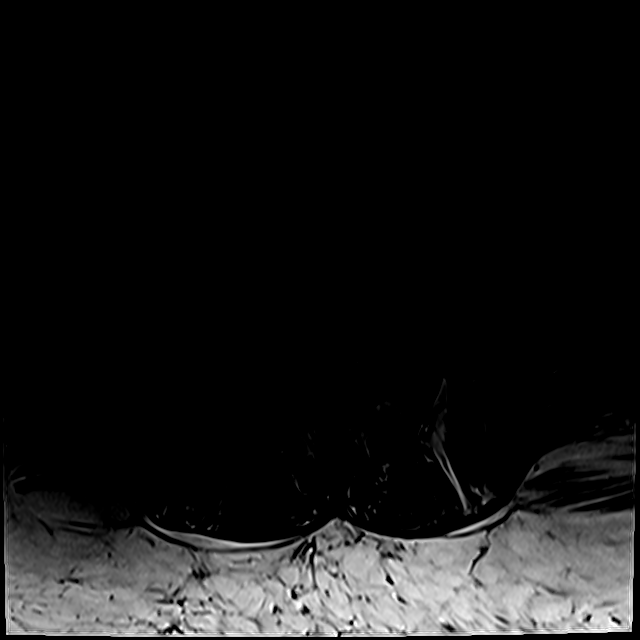
[im 22/42]
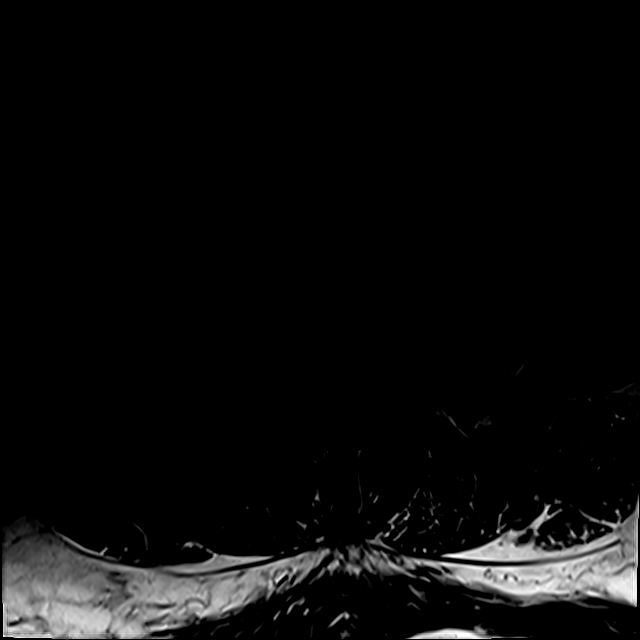
[im 36/42]
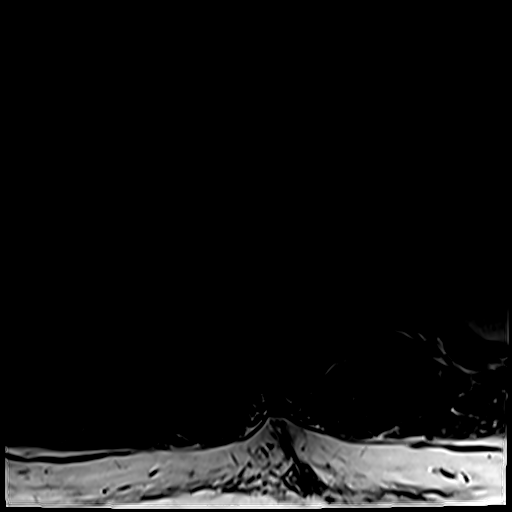

[Series 104: T2 · axial · 4.0mm · 0.28mm/px · z∈[-27,+148]mm · 8 of 42 slices shown (2 of 2)]
[im 3/42]
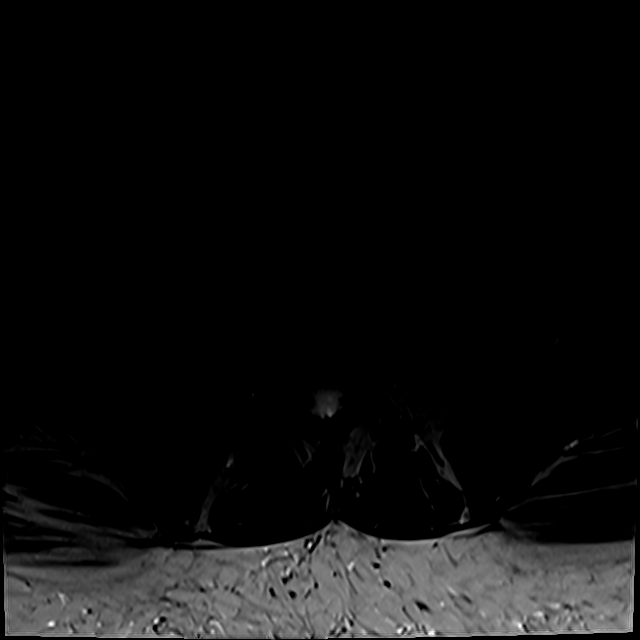
[im 6/42]
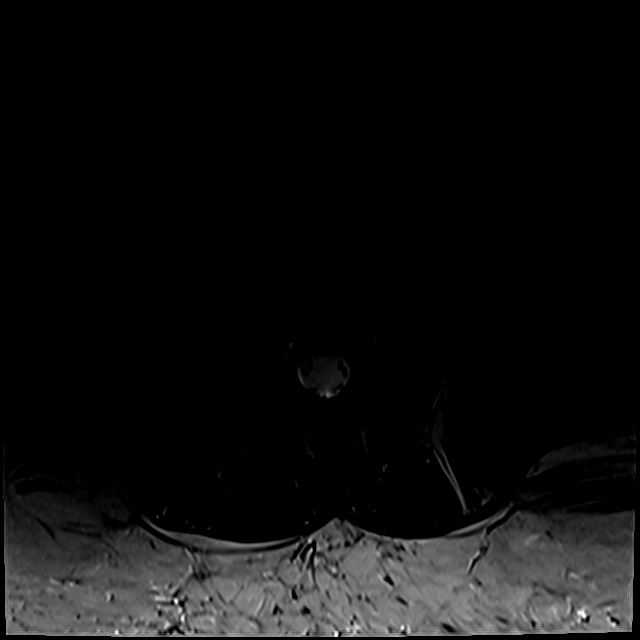
[im 9/42]
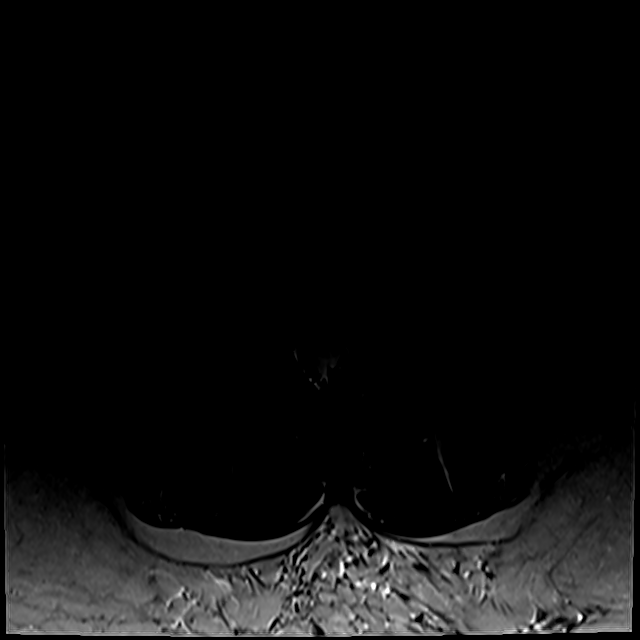
[im 14/42]
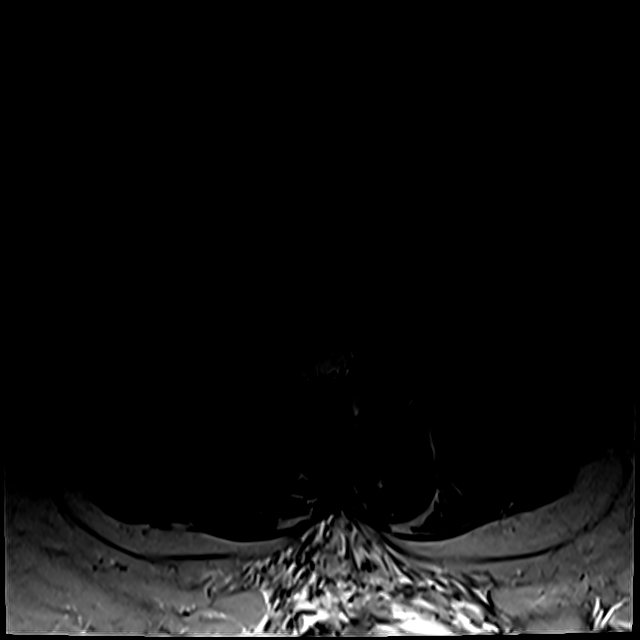
[im 20/42]
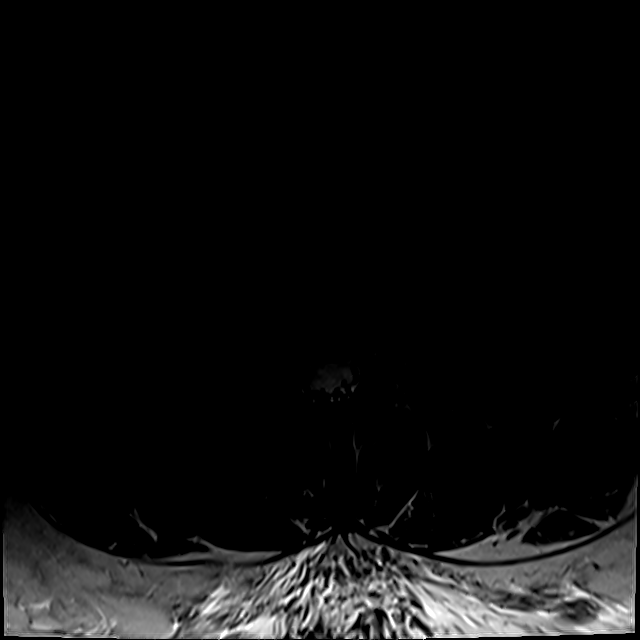
[im 22/42]
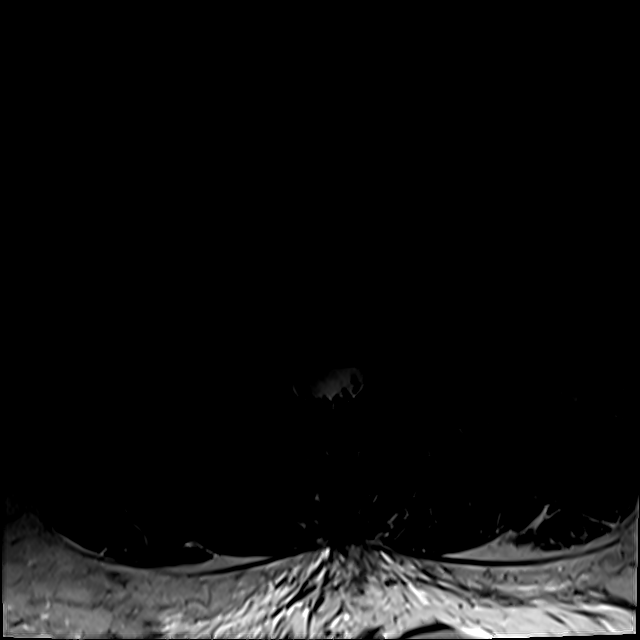
[im 25/42]
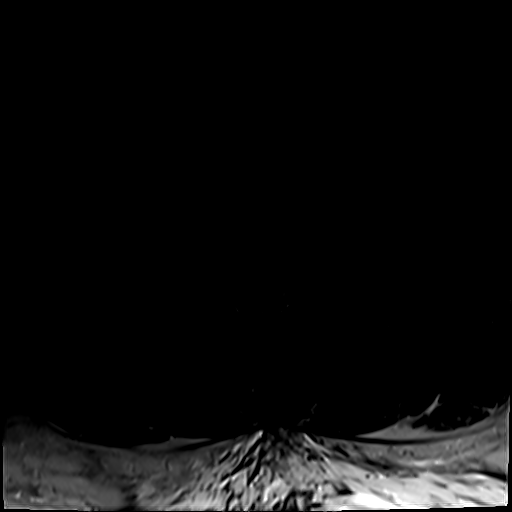
[im 36/42]
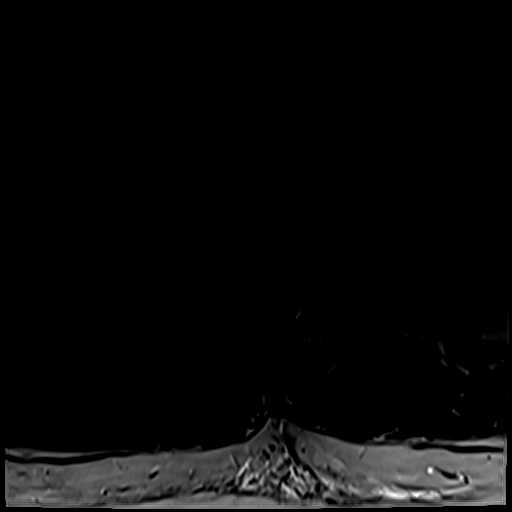

[19 of 48 positions shown; findings below may reference images not displayed]

FINDINGS: Segmentation:  Standard.

Alignment:  Physiologic.

Vertebrae: Diffuse decrease of the T1 signal throughout the
visualized spine, may represent red marrow reconversion, although
marrow replacement pathologies cannot be entirely excluded.
Correlate with CBC.

Conus medullaris and cauda equina: Conus extends to the L2 level.
Conus and cauda equina appear normal.

Paraspinal and other soft tissues: Retroaortic left renal vein.

Disc levels:

T12-L1:No spinal canal or neural foraminal stenosis.

L1-2:No spinal canal or neural foraminal stenosis.

L2-3:No spinal canal or neural foraminal stenosis.

L3-4:Mild facet degenerative changes with bilateral joint
effusion.No spinal canal or neural foraminal stenosis.

L4-5:Small central disc protrusion and mild facet degenerative
changes with bilateral joint effusion.No spinal canal or neural
foraminal stenosis.

L5-S1:No spinal canal or neural foraminal stenosis.
IMPRESSION: 1. Small central disc protrusion at L4-L5 and mild facet
degenerative changes at L3-L4 and L4-L5. No spinal canal or neural
foraminal stenosis at any level.
2. Diffuse decrease of the T1 signal throughout the visualized
spine, may represent red marrow reconversion, although marrow
replacement pathologies cannot be entirely excluded. Correlate with
CBC.

## 2022-04-12 DIAGNOSIS — M170 Bilateral primary osteoarthritis of knee: Secondary | ICD-10-CM | POA: Diagnosis not present

## 2022-04-12 DIAGNOSIS — E11628 Type 2 diabetes mellitus with other skin complications: Secondary | ICD-10-CM | POA: Diagnosis not present

## 2022-04-12 DIAGNOSIS — E7800 Pure hypercholesterolemia, unspecified: Secondary | ICD-10-CM | POA: Diagnosis not present

## 2022-04-12 DIAGNOSIS — R631 Polydipsia: Secondary | ICD-10-CM | POA: Diagnosis not present

## 2022-04-12 DIAGNOSIS — G4733 Obstructive sleep apnea (adult) (pediatric): Secondary | ICD-10-CM | POA: Diagnosis not present

## 2022-04-12 DIAGNOSIS — R3589 Other polyuria: Secondary | ICD-10-CM | POA: Diagnosis not present

## 2022-04-12 DIAGNOSIS — E119 Type 2 diabetes mellitus without complications: Secondary | ICD-10-CM | POA: Diagnosis not present

## 2022-04-14 DIAGNOSIS — E1165 Type 2 diabetes mellitus with hyperglycemia: Secondary | ICD-10-CM | POA: Diagnosis not present

## 2022-04-14 DIAGNOSIS — E7800 Pure hypercholesterolemia, unspecified: Secondary | ICD-10-CM | POA: Diagnosis not present

## 2022-04-19 DIAGNOSIS — M545 Low back pain, unspecified: Secondary | ICD-10-CM | POA: Diagnosis not present

## 2022-04-19 DIAGNOSIS — M25561 Pain in right knee: Secondary | ICD-10-CM | POA: Diagnosis not present

## 2022-04-19 DIAGNOSIS — M25562 Pain in left knee: Secondary | ICD-10-CM | POA: Diagnosis not present

## 2022-05-01 DIAGNOSIS — M170 Bilateral primary osteoarthritis of knee: Secondary | ICD-10-CM | POA: Diagnosis not present

## 2022-05-03 DIAGNOSIS — G4733 Obstructive sleep apnea (adult) (pediatric): Secondary | ICD-10-CM | POA: Diagnosis not present

## 2022-05-05 DIAGNOSIS — E1169 Type 2 diabetes mellitus with other specified complication: Secondary | ICD-10-CM | POA: Diagnosis not present

## 2022-05-05 DIAGNOSIS — E7800 Pure hypercholesterolemia, unspecified: Secondary | ICD-10-CM | POA: Diagnosis not present

## 2022-05-10 DIAGNOSIS — M170 Bilateral primary osteoarthritis of knee: Secondary | ICD-10-CM | POA: Diagnosis not present

## 2022-05-17 DIAGNOSIS — M170 Bilateral primary osteoarthritis of knee: Secondary | ICD-10-CM | POA: Diagnosis not present

## 2022-05-24 DIAGNOSIS — G4733 Obstructive sleep apnea (adult) (pediatric): Secondary | ICD-10-CM | POA: Diagnosis not present

## 2022-05-31 DIAGNOSIS — M47816 Spondylosis without myelopathy or radiculopathy, lumbar region: Secondary | ICD-10-CM | POA: Diagnosis not present

## 2022-06-20 DIAGNOSIS — M47816 Spondylosis without myelopathy or radiculopathy, lumbar region: Secondary | ICD-10-CM | POA: Diagnosis not present

## 2022-07-13 DIAGNOSIS — E1169 Type 2 diabetes mellitus with other specified complication: Secondary | ICD-10-CM | POA: Diagnosis not present

## 2022-07-13 DIAGNOSIS — E7800 Pure hypercholesterolemia, unspecified: Secondary | ICD-10-CM | POA: Diagnosis not present

## 2022-07-13 DIAGNOSIS — I872 Venous insufficiency (chronic) (peripheral): Secondary | ICD-10-CM | POA: Diagnosis not present

## 2022-08-02 DIAGNOSIS — R000 Tachycardia, unspecified: Secondary | ICD-10-CM | POA: Diagnosis not present

## 2022-08-02 DIAGNOSIS — Z6841 Body Mass Index (BMI) 40.0 and over, adult: Secondary | ICD-10-CM | POA: Diagnosis not present

## 2022-08-02 DIAGNOSIS — Z1211 Encounter for screening for malignant neoplasm of colon: Secondary | ICD-10-CM | POA: Diagnosis not present

## 2022-08-02 DIAGNOSIS — E1169 Type 2 diabetes mellitus with other specified complication: Secondary | ICD-10-CM | POA: Diagnosis not present

## 2022-08-02 DIAGNOSIS — L0231 Cutaneous abscess of buttock: Secondary | ICD-10-CM | POA: Diagnosis not present

## 2022-10-02 DIAGNOSIS — E1169 Type 2 diabetes mellitus with other specified complication: Secondary | ICD-10-CM | POA: Diagnosis not present

## 2022-10-26 DIAGNOSIS — E7800 Pure hypercholesterolemia, unspecified: Secondary | ICD-10-CM | POA: Diagnosis not present

## 2022-10-26 DIAGNOSIS — E1169 Type 2 diabetes mellitus with other specified complication: Secondary | ICD-10-CM | POA: Diagnosis not present

## 2022-10-26 DIAGNOSIS — Z23 Encounter for immunization: Secondary | ICD-10-CM | POA: Diagnosis not present

## 2022-11-09 DIAGNOSIS — G4733 Obstructive sleep apnea (adult) (pediatric): Secondary | ICD-10-CM | POA: Diagnosis not present

## 2022-11-09 DIAGNOSIS — Z6841 Body Mass Index (BMI) 40.0 and over, adult: Secondary | ICD-10-CM | POA: Diagnosis not present

## 2022-11-09 DIAGNOSIS — E1169 Type 2 diabetes mellitus with other specified complication: Secondary | ICD-10-CM | POA: Diagnosis not present

## 2022-11-09 DIAGNOSIS — E7800 Pure hypercholesterolemia, unspecified: Secondary | ICD-10-CM | POA: Diagnosis not present
